# Patient Record
Sex: Female | Born: 1985 | Race: Black or African American | Hispanic: No | Marital: Single | Smoking: Never smoker
Health system: Southern US, Community
[De-identification: ages and names within clinical notes are randomized; demographics above are authoritative.]

## PROBLEM LIST (undated history)

## (undated) ENCOUNTER — Inpatient Hospital Stay (HOSPITAL_COMMUNITY): Payer: Self-pay

## (undated) DIAGNOSIS — F419 Anxiety disorder, unspecified: Secondary | ICD-10-CM

## (undated) DIAGNOSIS — O039 Complete or unspecified spontaneous abortion without complication: Secondary | ICD-10-CM

## (undated) DIAGNOSIS — O9A219 Injury, poisoning and certain other consequences of external causes complicating pregnancy, unspecified trimester: Secondary | ICD-10-CM

## (undated) DIAGNOSIS — F329 Major depressive disorder, single episode, unspecified: Secondary | ICD-10-CM

## (undated) DIAGNOSIS — F32A Depression, unspecified: Secondary | ICD-10-CM

## (undated) DIAGNOSIS — I493 Ventricular premature depolarization: Secondary | ICD-10-CM

## (undated) DIAGNOSIS — I10 Essential (primary) hypertension: Secondary | ICD-10-CM

## (undated) DIAGNOSIS — Z8759 Personal history of other complications of pregnancy, childbirth and the puerperium: Secondary | ICD-10-CM

## (undated) DIAGNOSIS — Z98891 History of uterine scar from previous surgery: Secondary | ICD-10-CM

## (undated) DIAGNOSIS — K122 Cellulitis and abscess of mouth: Secondary | ICD-10-CM

## (undated) DIAGNOSIS — D219 Benign neoplasm of connective and other soft tissue, unspecified: Secondary | ICD-10-CM

## (undated) DIAGNOSIS — R51 Headache: Secondary | ICD-10-CM

## (undated) DIAGNOSIS — Z5189 Encounter for other specified aftercare: Secondary | ICD-10-CM

## (undated) DIAGNOSIS — R0600 Dyspnea, unspecified: Secondary | ICD-10-CM

## (undated) DIAGNOSIS — D649 Anemia, unspecified: Secondary | ICD-10-CM

## (undated) DIAGNOSIS — R000 Tachycardia, unspecified: Secondary | ICD-10-CM

## (undated) DIAGNOSIS — R519 Headache, unspecified: Secondary | ICD-10-CM

## (undated) HISTORY — PX: DILATION AND CURETTAGE OF UTERUS: SHX78

## (undated) HISTORY — DX: History of uterine scar from previous surgery: Z98.891

## (undated) HISTORY — DX: Tachycardia, unspecified: R00.0

## (undated) HISTORY — DX: Ventricular premature depolarization: I49.3

## (undated) HISTORY — PX: WISDOM TOOTH EXTRACTION: SHX21

## (undated) HISTORY — DX: Injury, poisoning and certain other consequences of external causes complicating pregnancy, unspecified trimester: O9A.219

## (undated) HISTORY — PX: ROOT CANAL: SHX2363

## (undated) HISTORY — PX: MYOMECTOMY: SHX85

---

## 2010-10-01 ENCOUNTER — Inpatient Hospital Stay (HOSPITAL_COMMUNITY)
Admission: AD | Admit: 2010-10-01 | Discharge: 2010-10-01 | Disposition: A | Discharge status: Home / Self Care | Payer: Medicaid Other | Source: Ambulatory Visit | Attending: Obstetrics & Gynecology | Admitting: Obstetrics & Gynecology

## 2010-10-01 ENCOUNTER — Inpatient Hospital Stay (HOSPITAL_COMMUNITY): Payer: Medicaid Other

## 2010-10-01 DIAGNOSIS — O9989 Other specified diseases and conditions complicating pregnancy, childbirth and the puerperium: Secondary | ICD-10-CM

## 2010-10-01 DIAGNOSIS — O99891 Other specified diseases and conditions complicating pregnancy: Secondary | ICD-10-CM

## 2010-10-01 DIAGNOSIS — N949 Unspecified condition associated with female genital organs and menstrual cycle: Secondary | ICD-10-CM | POA: Insufficient documentation

## 2010-10-01 LAB — URINALYSIS, ROUTINE W REFLEX MICROSCOPIC
Glucose, UA: NEGATIVE mg/dL
Leukocytes, UA: NEGATIVE
Nitrite: NEGATIVE
Specific Gravity, Urine: >1.03 — ABNORMAL HIGH (ref 1.005–1.030)
pH: 6 (ref 5.0–8.0)

## 2010-10-01 LAB — POCT PREGNANCY, URINE: Preg Test, Ur: POSITIVE

## 2010-10-01 LAB — CBC
MCH: 30.8 pg (ref 26.0–34.0)
MCHC: 34.1 g/dL (ref 30.0–36.0)
MCV: 90.1 fL (ref 78.0–100.0)
Platelets: 206 10*3/uL (ref 150–400)
RDW: 12.7 % (ref 11.5–15.5)

## 2010-10-01 LAB — WET PREP, GENITAL

## 2010-10-01 LAB — URINE MICROSCOPIC-ADD ON

## 2010-10-02 ENCOUNTER — Other Ambulatory Visit: Payer: Self-pay | Admitting: Obstetrics & Gynecology

## 2010-10-02 DIAGNOSIS — O3680X0 Pregnancy with inconclusive fetal viability, not applicable or unspecified: Secondary | ICD-10-CM

## 2010-10-02 LAB — GC/CHLAMYDIA PROBE AMP, GENITAL
Chlamydia, DNA Probe: NEGATIVE
GC Probe Amp, Genital: NEGATIVE

## 2010-10-06 ENCOUNTER — Inpatient Hospital Stay (HOSPITAL_COMMUNITY)
Admission: AD | Admit: 2010-10-06 | Discharge: 2010-10-06 | Disposition: A | Discharge status: Home / Self Care | Payer: Medicaid Other | Source: Ambulatory Visit | Attending: Obstetrics & Gynecology | Admitting: Obstetrics & Gynecology

## 2010-10-06 ENCOUNTER — Ambulatory Visit (HOSPITAL_COMMUNITY)
Admission: RE | Admit: 2010-10-06 | Discharge: 2010-10-06 | Disposition: A | Discharge status: Home / Self Care | Payer: Medicaid Other | Source: Ambulatory Visit | Attending: Obstetrics & Gynecology | Admitting: Obstetrics & Gynecology

## 2010-10-06 DIAGNOSIS — O3680X0 Pregnancy with inconclusive fetal viability, not applicable or unspecified: Secondary | ICD-10-CM

## 2010-10-06 DIAGNOSIS — O200 Threatened abortion: Secondary | ICD-10-CM

## 2010-10-06 DIAGNOSIS — O368390 Maternal care for abnormalities of the fetal heart rate or rhythm, unspecified trimester, not applicable or unspecified: Secondary | ICD-10-CM | POA: Insufficient documentation

## 2010-10-06 DIAGNOSIS — O209 Hemorrhage in early pregnancy, unspecified: Secondary | ICD-10-CM | POA: Insufficient documentation

## 2010-10-06 LAB — CBC
Hemoglobin: 12.7 g/dL (ref 12.0–15.0)
MCHC: 33.3 g/dL (ref 30.0–36.0)
RBC: 4.18 MIL/uL (ref 3.87–5.11)
WBC: 6.7 10*3/uL (ref 4.0–10.5)

## 2010-10-08 ENCOUNTER — Other Ambulatory Visit (HOSPITAL_COMMUNITY): Payer: Medicaid Other

## 2010-10-11 ENCOUNTER — Inpatient Hospital Stay (HOSPITAL_COMMUNITY)
Admission: AD | Admit: 2010-10-11 | Discharge: 2010-10-11 | Disposition: A | Discharge status: Home / Self Care | Payer: Medicaid Other | Source: Ambulatory Visit | Attending: Family Medicine | Admitting: Family Medicine

## 2010-10-11 ENCOUNTER — Inpatient Hospital Stay (HOSPITAL_COMMUNITY): Payer: Medicaid Other

## 2010-10-11 DIAGNOSIS — O034 Incomplete spontaneous abortion without complication: Secondary | ICD-10-CM | POA: Insufficient documentation

## 2010-10-11 DIAGNOSIS — R109 Unspecified abdominal pain: Secondary | ICD-10-CM | POA: Insufficient documentation

## 2010-10-11 LAB — CBC
Hemoglobin: 11.7 g/dL — ABNORMAL LOW (ref 12.0–15.0)
MCV: 90.1 fL (ref 78.0–100.0)
Platelets: 206 10*3/uL (ref 150–400)
RBC: 3.94 MIL/uL (ref 3.87–5.11)
WBC: 10.4 10*3/uL (ref 4.0–10.5)

## 2010-10-25 ENCOUNTER — Encounter (INDEPENDENT_AMBULATORY_CARE_PROVIDER_SITE_OTHER): Payer: Medicaid Other | Admitting: Advanced Practice Midwife

## 2010-10-25 ENCOUNTER — Other Ambulatory Visit: Payer: Self-pay | Admitting: Obstetrics and Gynecology

## 2010-10-25 ENCOUNTER — Ambulatory Visit (HOSPITAL_COMMUNITY)
Admission: RE | Admit: 2010-10-25 | Discharge: 2010-10-25 | Disposition: A | Discharge status: Home / Self Care | Payer: Medicaid Other | Source: Ambulatory Visit | Attending: Obstetrics and Gynecology | Admitting: Obstetrics and Gynecology

## 2010-10-25 ENCOUNTER — Encounter: Payer: Self-pay | Admitting: Obstetrics and Gynecology

## 2010-10-25 DIAGNOSIS — IMO0002 Reserved for concepts with insufficient information to code with codable children: Secondary | ICD-10-CM | POA: Insufficient documentation

## 2010-10-25 DIAGNOSIS — O034 Incomplete spontaneous abortion without complication: Secondary | ICD-10-CM

## 2010-10-25 DIAGNOSIS — D251 Intramural leiomyoma of uterus: Secondary | ICD-10-CM | POA: Insufficient documentation

## 2010-10-26 NOTE — Progress Notes (Unsigned)
Tiffany Solomon, SPICER NO.:  [id number]  MEDICAL RECORD NO.:  [id number]           PATIENT TYPE:  A  LOCATION:  [facility]                   FACILITY:  [facility]  PHYSICIAN:  Argentina Donovan, MD        DATE OF BIRTH:  02-17-1986  DATE OF SERVICE:  10/25/2010                                 CLINIC NOTE  The patient is a 25 year old, gravida 1, para 0-0-1-0 who went into the MAU in early pregnancy on October 01, 2010.  She had an ultrasound and abated at that time.  She went in because of some bleeding.  The ultrasound looked okay, but they did see some chronic bleed and her beta at that time was 25,126.  She returned because of pain and bleeding on the 7th.  They put her on Cytotec by mouth 800 mcg.  She had heavy bleeding the following day with a lot of pain.  She waited a few days. The bleeding did not let up nor the pain and she went back on March 12. At that point, they repeated the 800 mcg of Cytotec, but they gave it to her vaginally.  For the next few days, the heavy bleeding had increased. The pain was much worse and then she passed a big clot.  The pain went away, but the bleeding has continued and now at this time, 24 days later she is still bleeding like a period.  I have given a quantitative beta on her.  We are going to repeat the ultrasound.  I am going to have her come back next week Monday.  Also, given her prescription for Sprintec. She wants to start birth control pills as soon as we know what is going on with her.  IMPRESSION:  Complete abortion versus incomplete abortion.  My feeling is that this is just some residual bleeding and that will eventually stop.  However, 24 days is a long time to be bleeding, so we will follow her up a little closer than usual.          ______________________________ Argentina Donovan, MD    PR/MEDQ  D:  10/25/2010  T:  10/26/2010  Job:  [id number]

## 2010-10-31 DEATH — deceased

## 2010-11-02 ENCOUNTER — Inpatient Hospital Stay (HOSPITAL_COMMUNITY)
Admission: AD | Admit: 2010-11-02 | Discharge: 2010-11-02 | Disposition: A | Discharge status: Home / Self Care | Payer: Medicaid Other | Source: Ambulatory Visit | Attending: Obstetrics & Gynecology | Admitting: Obstetrics & Gynecology

## 2010-11-02 DIAGNOSIS — N949 Unspecified condition associated with female genital organs and menstrual cycle: Secondary | ICD-10-CM | POA: Insufficient documentation

## 2010-11-02 DIAGNOSIS — N938 Other specified abnormal uterine and vaginal bleeding: Secondary | ICD-10-CM

## 2010-11-02 DIAGNOSIS — D259 Leiomyoma of uterus, unspecified: Secondary | ICD-10-CM | POA: Insufficient documentation

## 2010-11-02 LAB — CBC
MCV: 89 fL (ref 78.0–100.0)
Platelets: 309 10*3/uL (ref 150–400)
RBC: 3.17 MIL/uL — ABNORMAL LOW (ref 3.87–5.11)
WBC: 7.7 10*3/uL (ref 4.0–10.5)

## 2010-11-04 ENCOUNTER — Other Ambulatory Visit: Payer: Self-pay | Admitting: Obstetrics & Gynecology

## 2010-11-04 ENCOUNTER — Ambulatory Visit (HOSPITAL_COMMUNITY)
Admission: RE | Admit: 2010-11-04 | Discharge: 2010-11-04 | Disposition: A | Discharge status: Home / Self Care | Payer: Medicaid Other | Source: Ambulatory Visit | Attending: Obstetrics & Gynecology | Admitting: Obstetrics & Gynecology

## 2010-11-04 ENCOUNTER — Ambulatory Visit (INDEPENDENT_AMBULATORY_CARE_PROVIDER_SITE_OTHER): Payer: Medicaid Other | Admitting: Obstetrics and Gynecology

## 2010-11-04 DIAGNOSIS — O034 Incomplete spontaneous abortion without complication: Secondary | ICD-10-CM | POA: Insufficient documentation

## 2010-11-04 DIAGNOSIS — O036 Delayed or excessive hemorrhage following complete or unspecified spontaneous abortion: Secondary | ICD-10-CM

## 2010-11-04 DIAGNOSIS — N949 Unspecified condition associated with female genital organs and menstrual cycle: Secondary | ICD-10-CM

## 2010-11-04 LAB — CBC
HCT: 23.9 % — ABNORMAL LOW (ref 36.0–46.0)
Hemoglobin: 7.8 g/dL — ABNORMAL LOW (ref 12.0–15.0)
MCHC: 32.6 g/dL (ref 30.0–36.0)
WBC: 8.2 10*3/uL (ref 4.0–10.5)

## 2010-11-05 NOTE — Progress Notes (Signed)
Tiffany Solomon, NADER NO.:  [id number]  MEDICAL RECORD NO.:  [id number]           PATIENT TYPE:  O  LOCATION:  [facility]                    [facility]  PHYSICIAN:  Argentina Donovan, MD        DATE OF BIRTH:  June 24, 1986  DATE OF SERVICE:  11/04/2010                                 CLINIC NOTE  The patient is a 25 year old gravida 1, para 1-0-0-1-0-1 to the MAU with early pregnancy on October 01, 2010.  She had an ultrasound aborted at that time.  She was in because of bleeding.  The ultrasound looked okay. They did see some chronic bleeding and her beta was 25,000 at that time. She returned because of the pain.  She was put on Cytotec by mouth 800 mcg.  She had a lot of pain at that time following that followed by heavy bleeding.  She went back on October 11, 2010.  They repeated the Cytotec.  At this time gave it to her vaginally.  Pain was much worse this time where she passed large clot.  The ultrasound did show that she had, had fibroid uterus.  This displaced the endometrial stripe.  The bleeding however, continues.  At the time she was first seen she had a hemoglobin of 12.7.  She apparently has continued to bleed and ended up 2 days ago in the MAU where she was seen still bleeding and still passing clots and at that point had a hemoglobin of 9.3.  Yesterday, the fourth, she said she felt very dizzy and fainted at home.  She drank fluids and was able to get up this morning.  She had something small to eat this morning and went to work.  At work, she got very dizzy, diaphoretic, and came in here still passing clots and bleeding.  I have called Dr. Debroah Loop.  She has not eaten since morning.  We are sending her up to MAU to get a CBC, type and cross, and start an IV on her to prepare for possible D and C.  IMPRESSION:  Possible abortion bleeding with uterine fibroids.          ______________________________ Argentina Donovan, MD    PR/MEDQ  D:  11/04/2010  T:   11/05/2010  Job:  [id number]

## 2010-11-08 LAB — CROSSMATCH
Antibody Screen: NEGATIVE
Unit division: 0

## 2010-11-13 NOTE — Op Note (Signed)
  Tiffany Solomon, Tiffany Solomon              ACCOUNT NO.:  [id number]  MEDICAL RECORD NO.:  [id number]           PATIENT TYPE:  O  LOCATION:  [facility]                          [facility]  PHYSICIAN:  Scheryl Darter, MD       DATE OF BIRTH:  02/12/1986  DATE OF PROCEDURE: DATE OF DISCHARGE:                              OPERATIVE REPORT   PROCEDURE:  Suction, dilation and curettage.  PREOPERATIVE DIAGNOSIS:  Incomplete abortion.  POSTOPERATIVE DIAGNOSIS:  Incomplete abortion.  SURGEON:  Scheryl Darter, MD  ANESTHESIA:  MAC by Dr. Arby Barrette and Pat Patrick, CRNA  SPECIMEN:  Products of conception.  FINDINGS:  Uterus sounds to 11 cm.  ESTIMATED BLOOD LOSS:  Minimal.  COMPLICATIONS:  None.  DRAINS:  None.  COUNTS:  Correct.  OPERATIVE COURSE:  The patient gave written consent for suction, dilation and curettage with diagnosis of incomplete abortion.  The patient had received 2 doses of Cytotec previously for miscarriage.  The patient identification was confirmed.  She was brought to the OR and MAC anesthesia was induced.  She was placed in dorsal lithotomy position. Perineum and vagina sterilely prepped and draped and bladder was drained with a red rubber catheter.  Speculum was inserted.  Cervix was visualized, grasped with a single-tooth tenaculum.  Lidocaine 1% was infiltrated for intracervical block.  Uterus sounded to 11 cm.  Cervix was dilated sufficiently to pass a 9-mm suction curette.  Suction curettage was performed and small amount of products of conception were obtained. Complete evacuation of uterine cavity was assured.  She received IV Pitocin during the procedure.  There is minimal bleeding at the end of procedure.  All instruments were removed.  The patient tolerated the procedure well without complications.  She was brought in stable condition to the recovery room.     Scheryl Darter, MD     JA/MEDQ  D:  11/04/2010  T:  11/05/2010  Job:   [id number]  Electronically Signed by Scheryl Darter MD on 11/13/2010 07:53:00 AM

## 2010-11-17 ENCOUNTER — Ambulatory Visit: Payer: Medicaid Other | Admitting: Obstetrics & Gynecology

## 2010-11-17 DIAGNOSIS — O034 Incomplete spontaneous abortion without complication: Secondary | ICD-10-CM

## 2010-11-17 DIAGNOSIS — D259 Leiomyoma of uterus, unspecified: Secondary | ICD-10-CM

## 2010-11-18 NOTE — Group Therapy Note (Signed)
NAMEELBONY, Tiffany Solomon              ACCOUNT NO.:  [id number]  MEDICAL RECORD NO.:  [id number]           PATIENT TYPE:  A  LOCATION:  [facility]                   [facility]  [facility]  PHYSICIAN:  Scheryl Darter, MD       DATE OF BIRTH:  [birth year]  DATE OF SERVICE:  11/17/2010                                 CLINIC NOTE  The patient is status post suction dilation and curettage on November 04, 2010, for incomplete miscarriage.  She has fibroid uterus.  Bleeding stopped about 3 days ago.  Still feels some fullness in the lower abdomen, which she thinks as her uterus.  She also feels somewhat weak. She was anemic at the time of surgery with a hemoglobin of 7.8. Pathology showed products of conception.  PHYSICAL EXAMINATION:  GENERAL:  She does not appear pale.  Her affect is normal. ABDOMEN:  Soft and nontender.  No mass. PELVIC/EXTERNAL GENITALIA:  Vagina appeared normal.  Uterus appears about 8-10 weeks size and firm consistent with fibroid uterus.  No adnexal masses.  She says she has prescription for oral contraceptives.  She can start these today.  We will check a CBC today.  She is to follow up on her anemia.  She will continue taking iron supplement.  We will have her return in 3-4 months to review her progress with her menstrual periods on oral contraceptives.     Scheryl Darter, MD    JA/MEDQ  D:  11/17/2010  T:  11/18/2010  Job:  [id number]

## 2010-11-19 ENCOUNTER — Ambulatory Visit (HOSPITAL_COMMUNITY)
Admission: AD | Admit: 2010-11-19 | Discharge: 2010-11-20 | Disposition: A | Discharge status: Home / Self Care | Payer: Medicaid Other | Source: Ambulatory Visit | Attending: Obstetrics and Gynecology | Admitting: Obstetrics and Gynecology

## 2010-11-19 DIAGNOSIS — D62 Acute posthemorrhagic anemia: Principal | ICD-10-CM | POA: Insufficient documentation

## 2010-11-20 LAB — CBC
Hemoglobin: 11.2 g/dL — ABNORMAL LOW (ref 12.0–15.0)
MCHC: 32.8 g/dL (ref 30.0–36.0)
WBC: 7.8 10*3/uL (ref 4.0–10.5)

## 2010-11-21 LAB — CROSSMATCH
Unit division: 0
Unit division: 0
Unit division: 0

## 2011-08-07 IMAGING — US US OB TRANSVAGINAL
1 series · 14 of 28 positions shown · non-contrast
Comparison: none

[Series 1: us ob transvaginal · 14 of 28 slices shown]
[im 2/28]
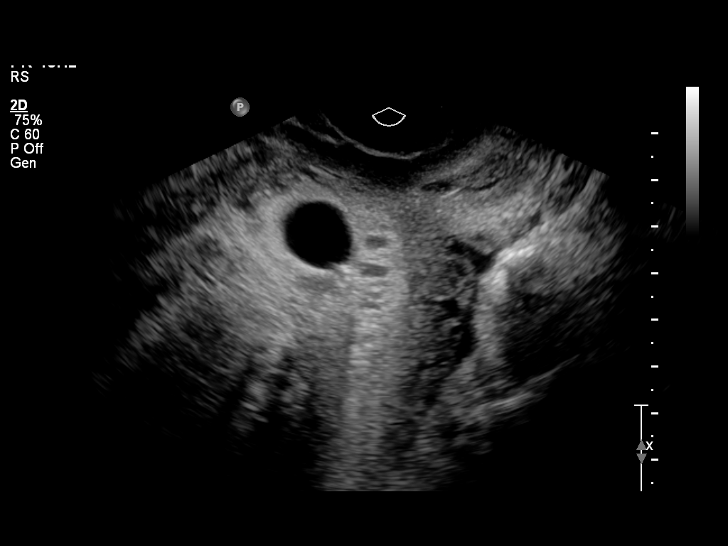
[im 4/28]
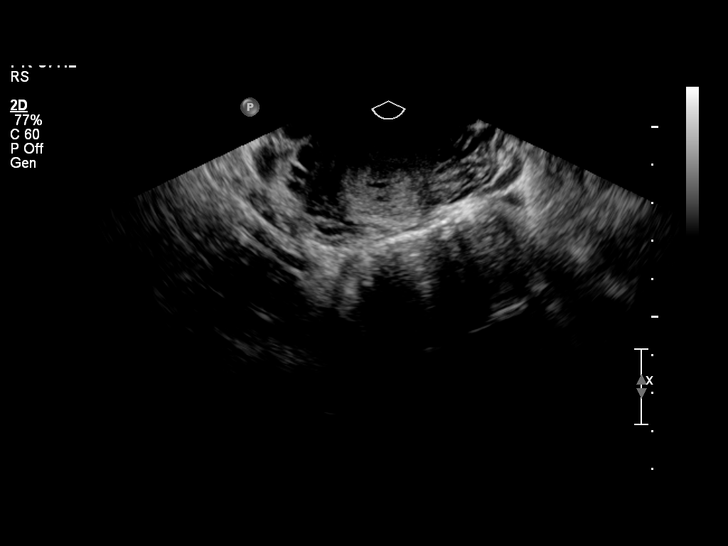
[im 6/28]
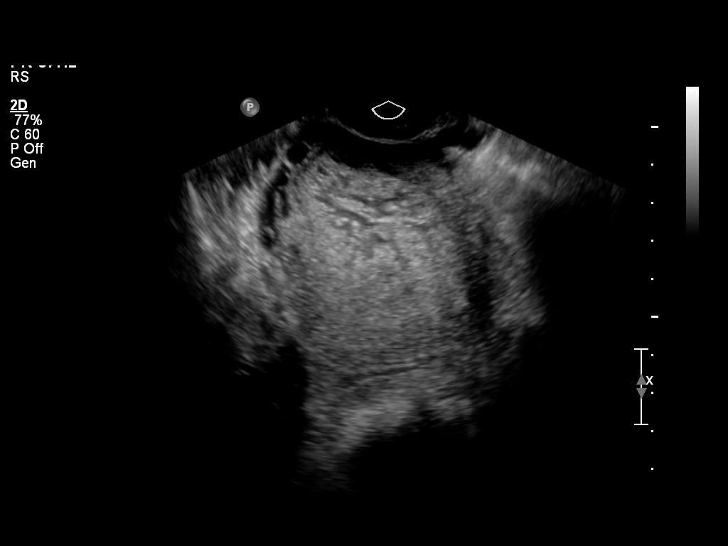
[im 8/28]
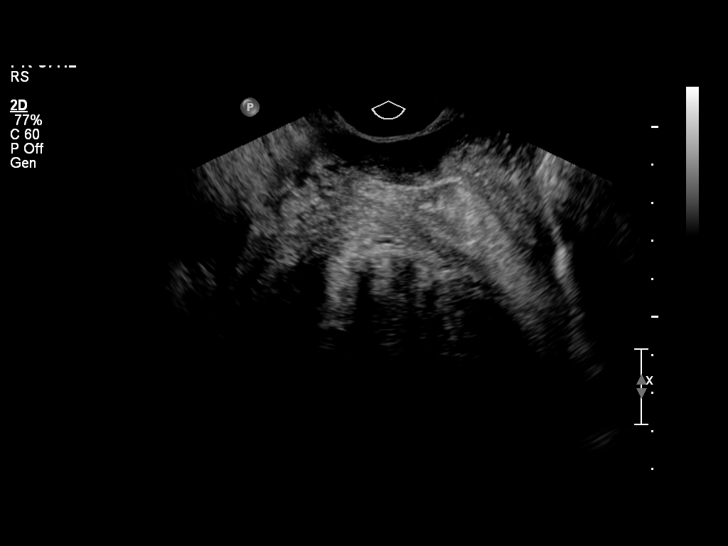
[im 10/28]
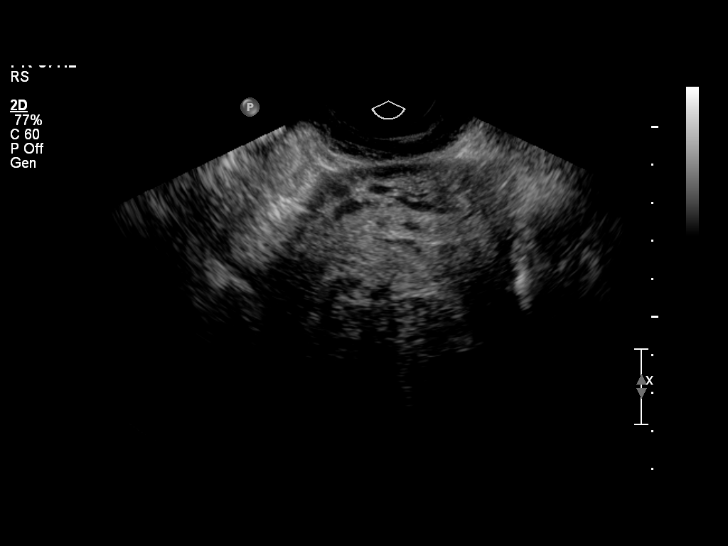
[im 12/28]
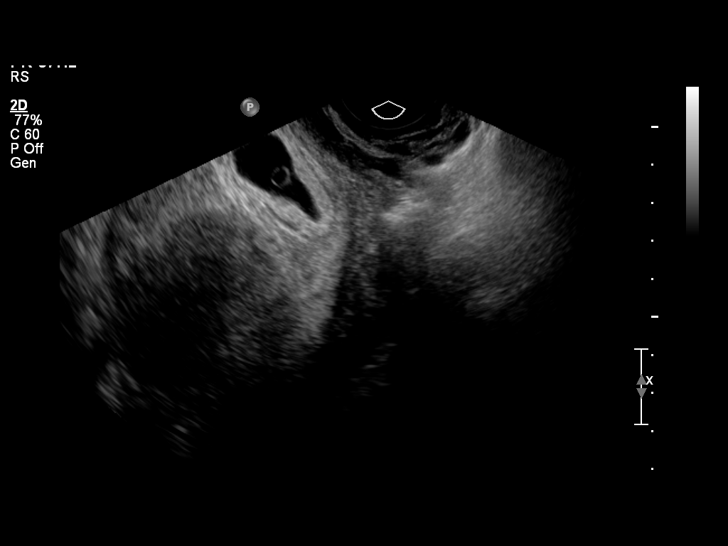
[im 14/28]
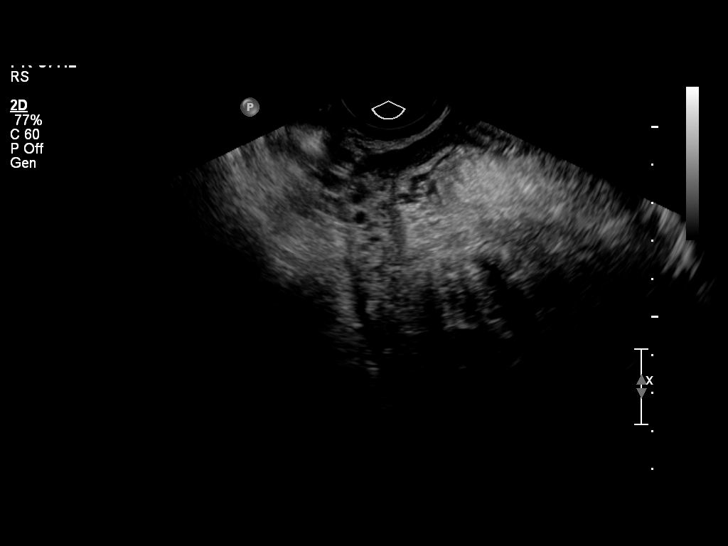
[im 16/28]
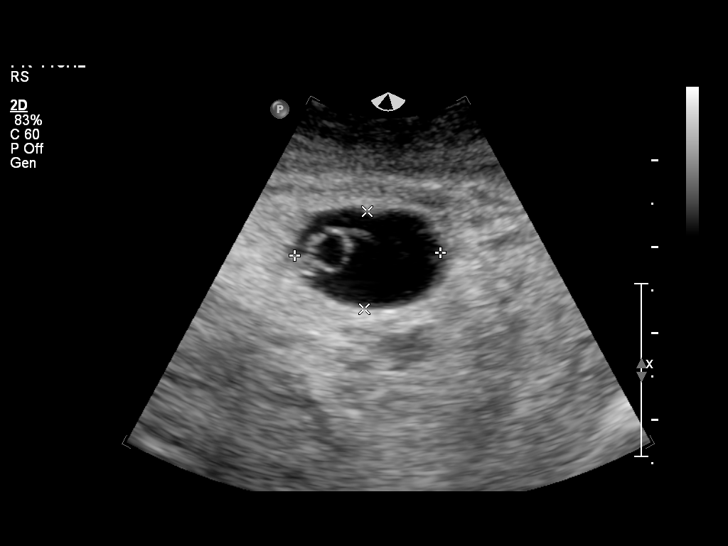
[im 18/28]
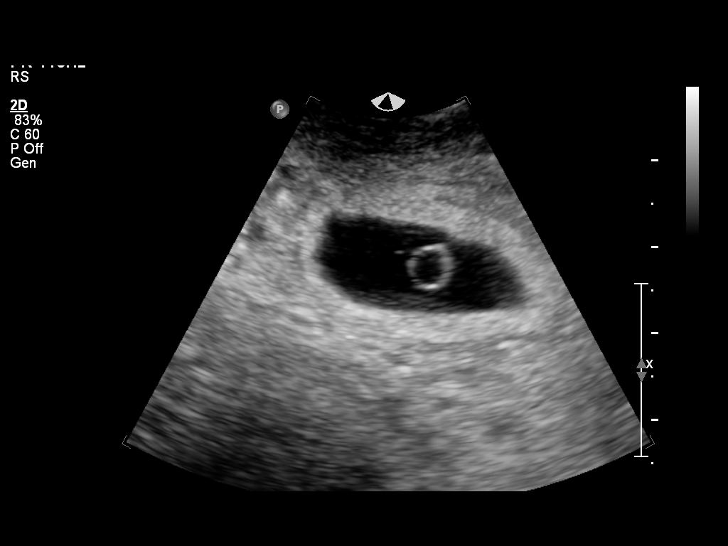
[im 20/28]
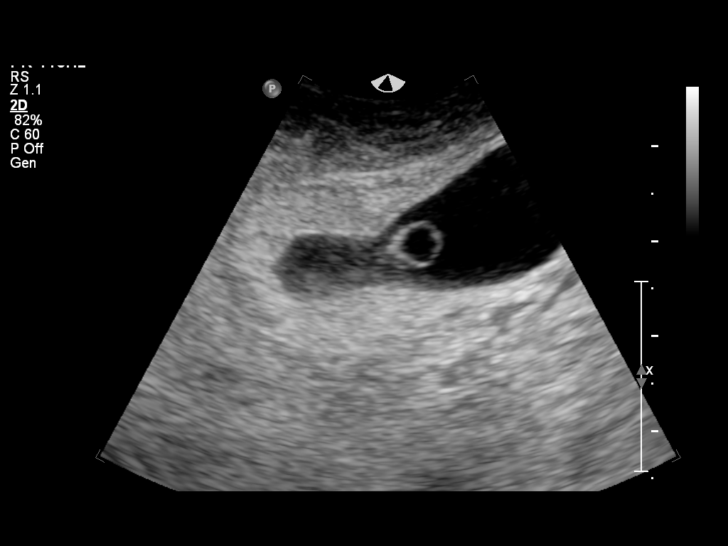
[im 22/28]
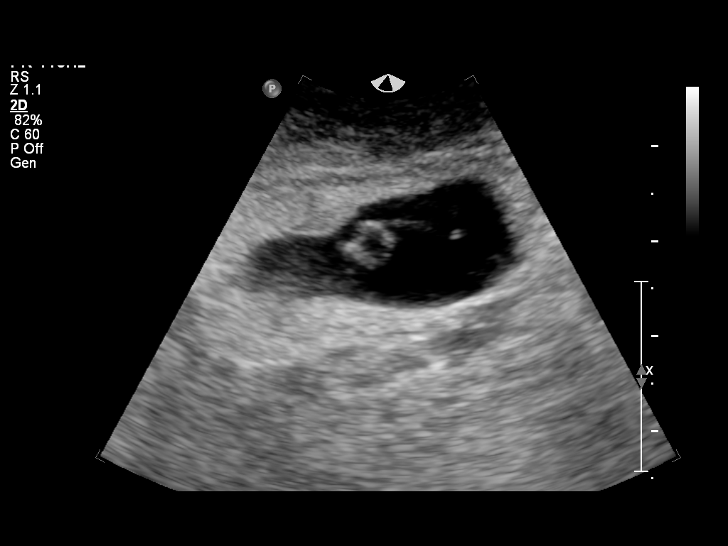
[im 24/28]
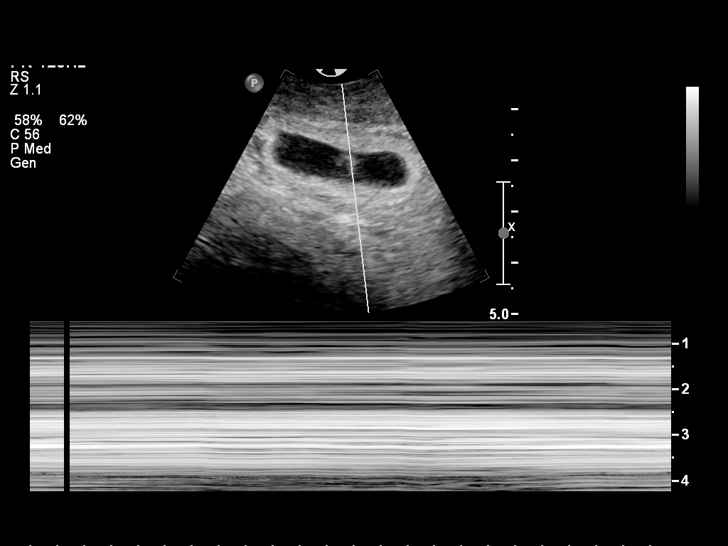
[im 26/28]
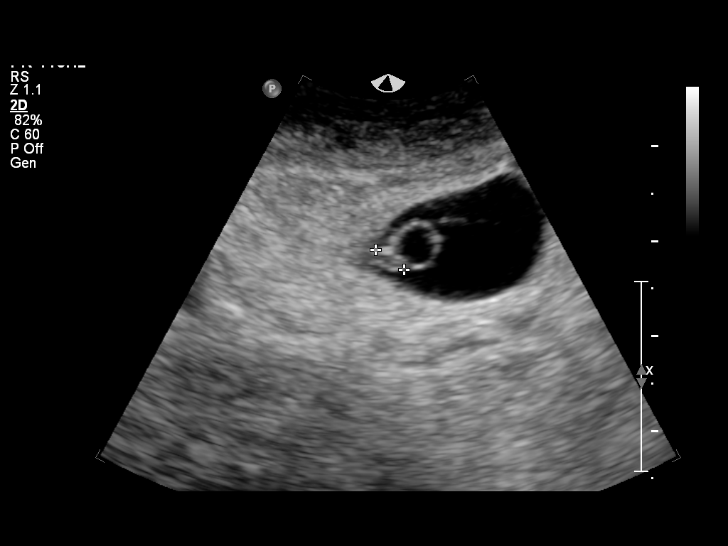
[im 28/28]
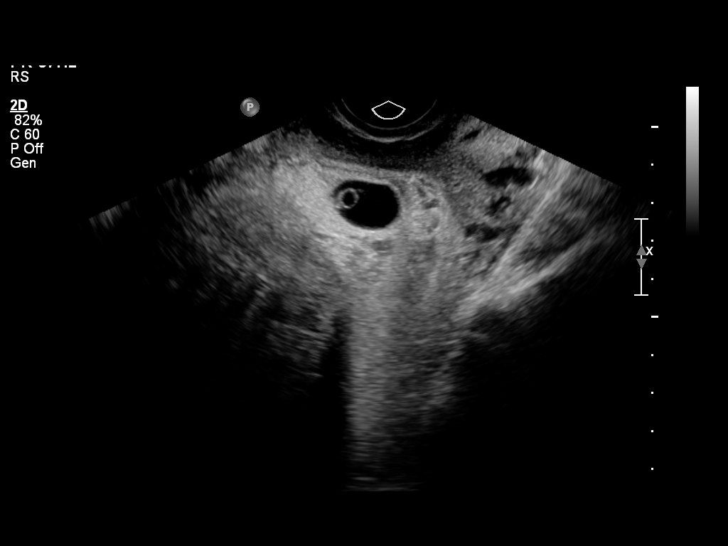

[14 of 28 positions shown; findings below may reference images not displayed]

OBSTETRICS REPORT
                      (Signed Final 10/06/2010 [DATE])

 Order#:         [id number]
Procedures

 US OB TRANSVAGINAL                                    76817.0
Indications

 No fetal cardiac activity detected;  assess viability
 Vaginal bleeding, unknown etiology
Fetal Evaluation

 Gest. Sac:         Intrauterine, small
                    subchorionic bleed
 Yolk Sac:          Visualized
 Fetal Pole:        Visualized
 Cardiac Activity:  Not visualized
Biometry

 GS:      17.7  mm     G. Age:  6w 6d                  EDD:    05/26/11
 CRL:      3.3  mm     G. Age:  6w 0d                  EDD:    06/01/11
Cervix Uterus Adnexa

 Cervix:       Closed.

 Adnexa:     No abnormality visualized.
Impression

 Intrauterine gestational sac, yolk sac, and fetal pole
 visualized but no cardiac activity yet seen. Lack of fetal
 growth and interval expected development of cardiac activity
 today compared to the prior study is compatible with failed
 IUP.
 Small subchorionic hemorrhage noted.

## 2011-08-26 IMAGING — US US PELVIS COMPLETE
1 series · 13 of 25 positions shown · non-contrast
Comparison: 10/11/2010

CLINICAL DATA: Vaginal bleeding.  Recent abortion.



[Series 1: us pelvis complete · 13 of 39 slices shown]
[im 1/39]
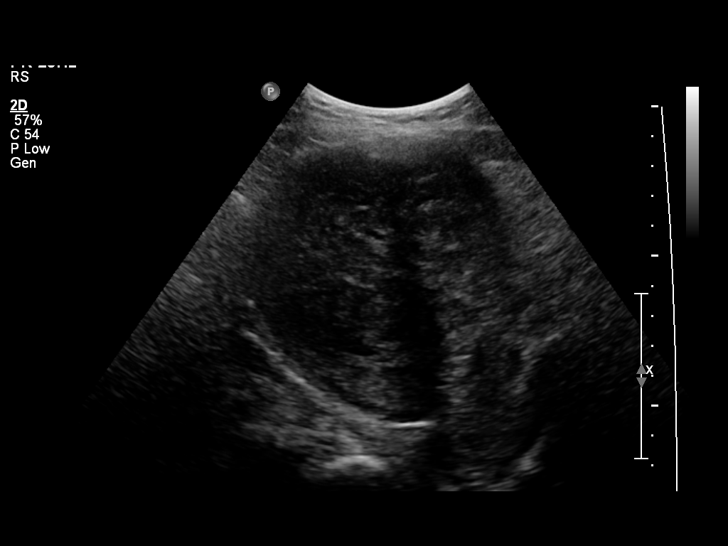
[im 4/39]
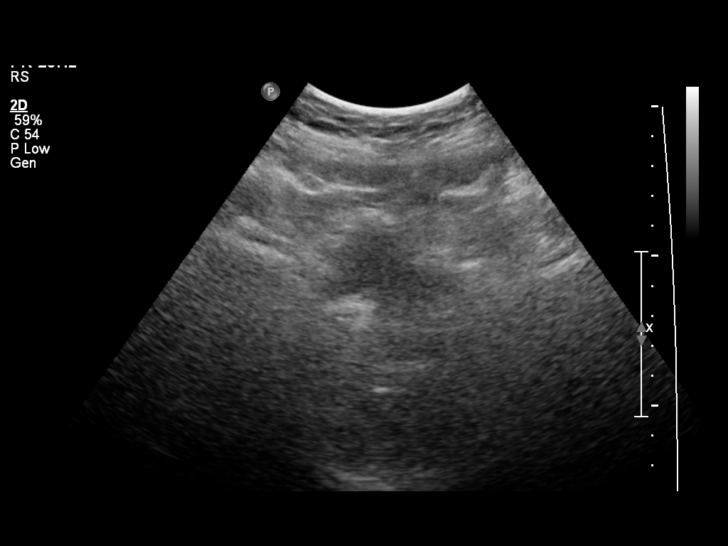
[im 7/39]
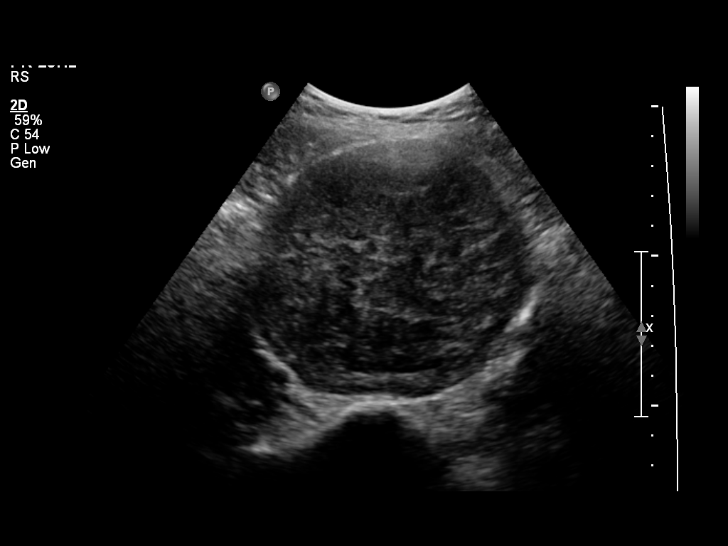
[im 10/39]
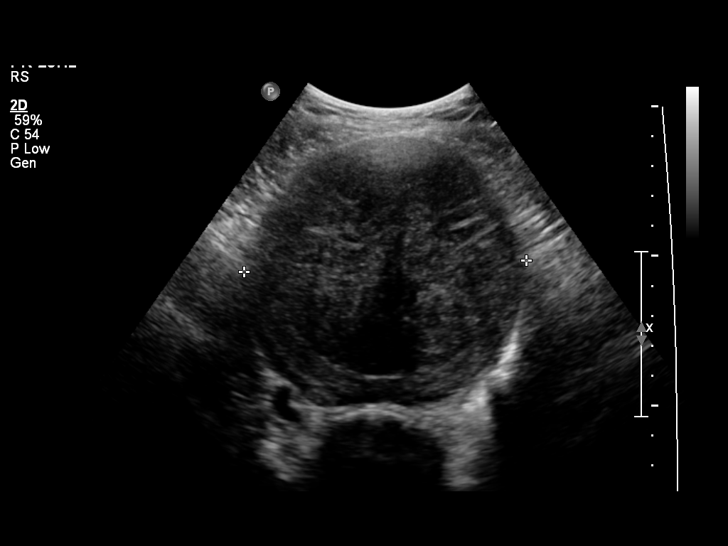
[im 13/39]
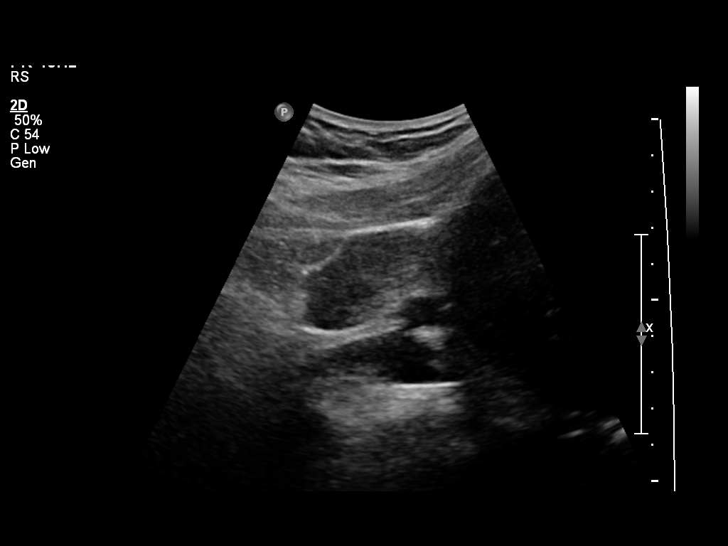
[im 16/39]
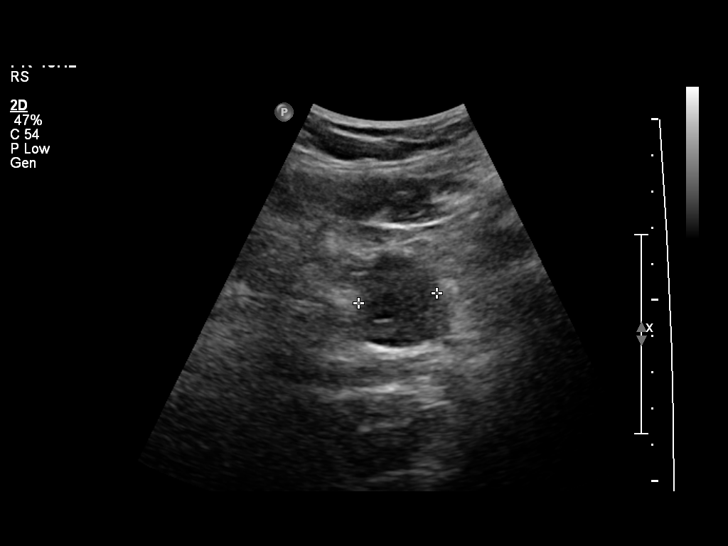
[im 20/39]
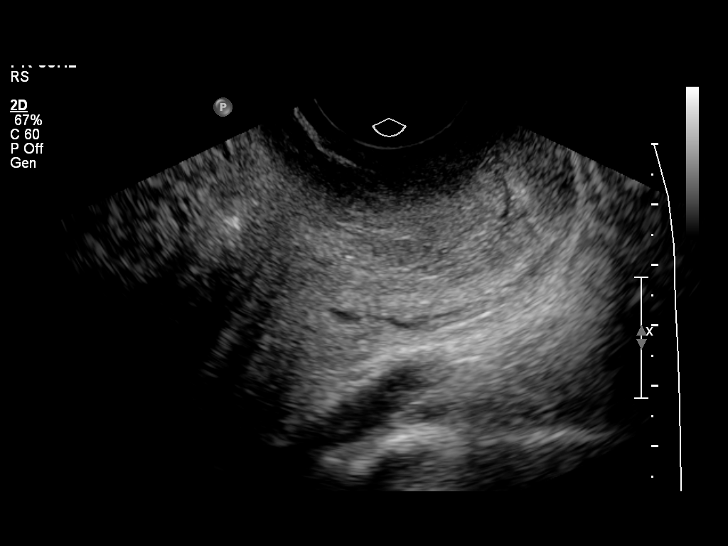
[im 23/39]
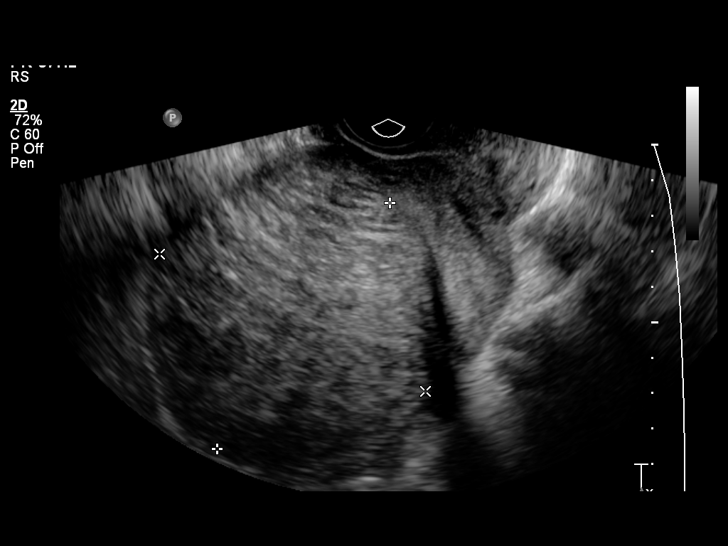
[im 26/39]
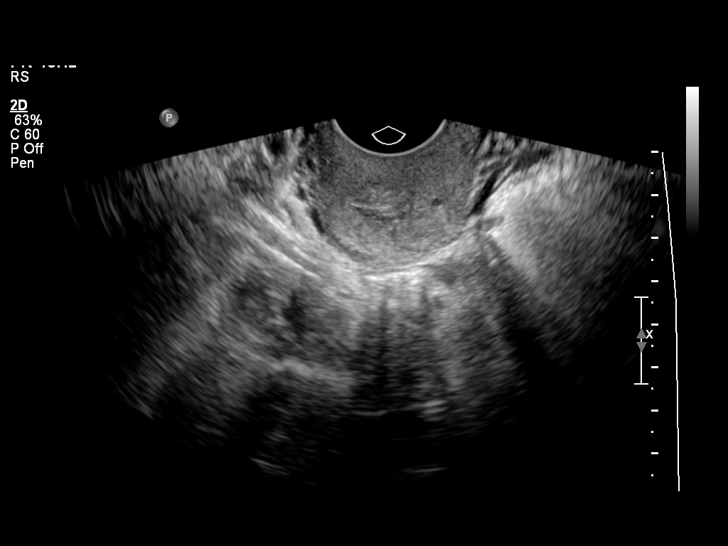
[im 29/39]
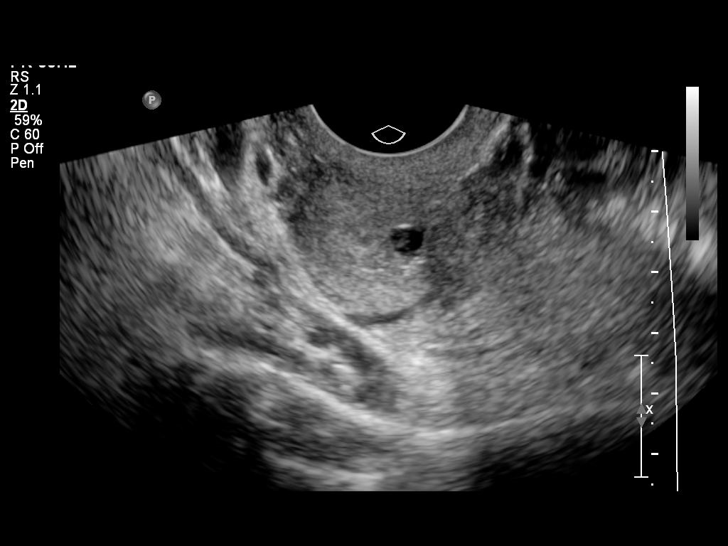
[im 32/39]
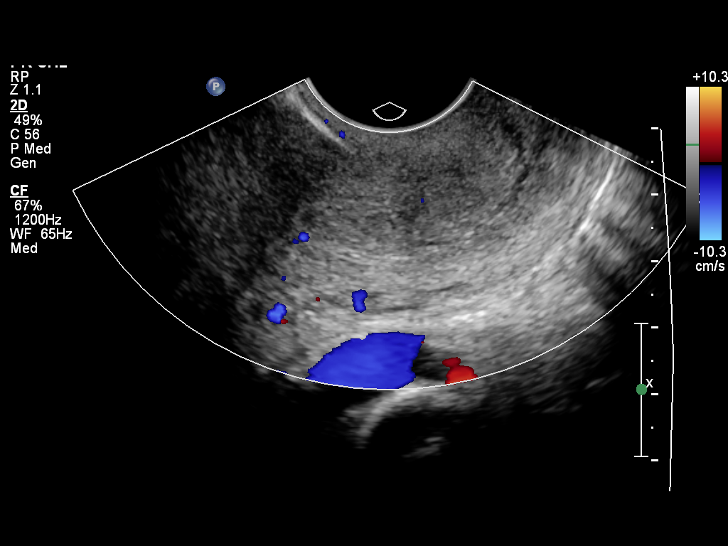
[im 35/39]
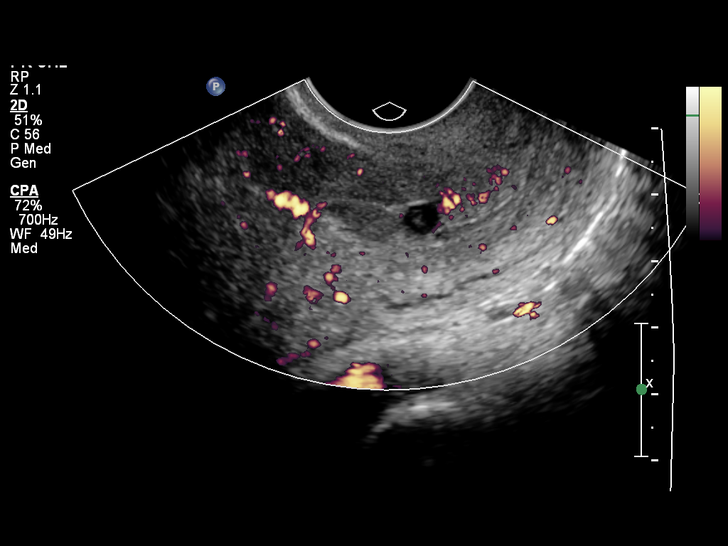
[im 39/39]
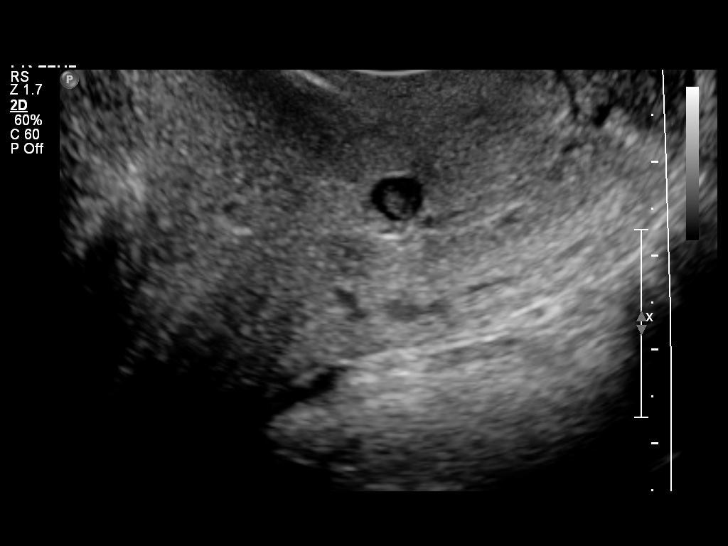

[13 of 25 positions shown; findings below may reference images not displayed]

FINDINGS: Uterus:  12.2 x 9.5 x 8.8 cm.  Anterior intramural fibroid
producing posterior displacement of the endometrial stripe measures
8.5 x 8.4 x 8.0 cm. No gestational sac identified.  Probable
Nabothian cyst or debris in the cervical canal incidentally noted.

Endometrium:  Obscured by overlying fibroid.  Previously questioned
echogenic prominence to the endometrium of the lower uterine
segment is no longer identified.  No focal abnormality visualized.

Right ovary:  Normal in size and appearance.  No adnexal mass.

Left ovary:  Normal in size and appearance on.  No adnexal mass.

Other findings:  No free fluid.
IMPRESSION: No sonographic evidence for retained products of conception,
allowing for obscuration of the endometrial stripe by large fundal
fibroid.

## 2012-12-25 ENCOUNTER — Ambulatory Visit: Payer: Self-pay

## 2014-01-13 ENCOUNTER — Other Ambulatory Visit (HOSPITAL_COMMUNITY)
Admission: RE | Admit: 2014-01-13 | Discharge: 2014-01-13 | Disposition: A | Discharge status: Home / Self Care | Payer: 59 | Source: Ambulatory Visit | Attending: Obstetrics and Gynecology | Admitting: Obstetrics and Gynecology

## 2014-01-13 ENCOUNTER — Other Ambulatory Visit: Payer: Self-pay | Admitting: Obstetrics and Gynecology

## 2014-01-13 DIAGNOSIS — Z01419 Encounter for gynecological examination (general) (routine) without abnormal findings: Secondary | ICD-10-CM | POA: Insufficient documentation

## 2014-01-13 DIAGNOSIS — Z113 Encounter for screening for infections with a predominantly sexual mode of transmission: Secondary | ICD-10-CM | POA: Insufficient documentation

## 2014-01-15 LAB — CYTOLOGY - PAP

## 2014-07-01 ENCOUNTER — Emergency Department (HOSPITAL_COMMUNITY)
Admission: EM | Admit: 2014-07-01 | Discharge: 2014-07-01 | Discharge status: Against Medical Advice | Payer: 59 | Attending: Emergency Medicine | Admitting: Emergency Medicine

## 2014-07-01 ENCOUNTER — Encounter (HOSPITAL_COMMUNITY): Payer: Self-pay | Admitting: *Deleted

## 2014-07-01 DIAGNOSIS — Z3A38 38 weeks gestation of pregnancy: Secondary | ICD-10-CM | POA: Insufficient documentation

## 2014-07-01 DIAGNOSIS — R0602 Shortness of breath: Secondary | ICD-10-CM | POA: Diagnosis not present

## 2014-07-01 DIAGNOSIS — O9989 Other specified diseases and conditions complicating pregnancy, childbirth and the puerperium: Secondary | ICD-10-CM | POA: Insufficient documentation

## 2014-07-01 LAB — CBC
HCT: 37.4 % (ref 36.0–46.0)
HEMOGLOBIN: 12.4 g/dL (ref 12.0–15.0)
MCH: 29.4 pg (ref 26.0–34.0)
MCHC: 33.2 g/dL (ref 30.0–36.0)
MCV: 88.6 fL (ref 78.0–100.0)
Platelets: 232 10*3/uL (ref 150–400)
RBC: 4.22 MIL/uL (ref 3.87–5.11)
RDW: 13.8 % (ref 11.5–15.5)
WBC: 9.2 10*3/uL (ref 4.0–10.5)

## 2014-07-01 LAB — BASIC METABOLIC PANEL
Anion gap: 12 (ref 5–15)
BUN: 3 mg/dL — AB (ref 6–23)
CHLORIDE: 102 meq/L (ref 96–112)
CO2: 23 mEq/L (ref 19–32)
CREATININE: 0.55 mg/dL (ref 0.50–1.10)
Calcium: 9.2 mg/dL (ref 8.4–10.5)
GFR calc non Af Amer: >90 mL/min (ref >90)
GLUCOSE: 94 mg/dL (ref 70–99)
POTASSIUM: 4 meq/L (ref 3.7–5.3)
Sodium: 137 mEq/L (ref 137–147)

## 2014-07-01 NOTE — ED Notes (Signed)
Sob x 4 days. Non productive. No cp, no dizziness.

## 2014-07-01 NOTE — ED Notes (Addendum)
Pt declined chest xray due to being [redacted]wks pregnant. Pt denies SOB at this time

## 2014-08-07 ENCOUNTER — Inpatient Hospital Stay (HOSPITAL_COMMUNITY): Payer: 59

## 2014-08-07 ENCOUNTER — Inpatient Hospital Stay (HOSPITAL_COMMUNITY)
Admission: AD | Admit: 2014-08-07 | Discharge: 2014-08-07 | Disposition: A | Discharge status: Home / Self Care | Payer: 59 | Source: Ambulatory Visit | Attending: Obstetrics and Gynecology | Admitting: Obstetrics and Gynecology

## 2014-08-07 ENCOUNTER — Encounter (HOSPITAL_COMMUNITY): Payer: Self-pay | Admitting: *Deleted

## 2014-08-07 DIAGNOSIS — Y92481 Parking lot as the place of occurrence of the external cause: Secondary | ICD-10-CM | POA: Diagnosis not present

## 2014-08-07 DIAGNOSIS — O9989 Other specified diseases and conditions complicating pregnancy, childbirth and the puerperium: Secondary | ICD-10-CM | POA: Diagnosis not present

## 2014-08-07 DIAGNOSIS — Z3A33 33 weeks gestation of pregnancy: Secondary | ICD-10-CM | POA: Diagnosis not present

## 2014-08-07 DIAGNOSIS — O368130 Decreased fetal movements, third trimester, not applicable or unspecified: Secondary | ICD-10-CM | POA: Insufficient documentation

## 2014-08-07 DIAGNOSIS — O9A213 Injury, poisoning and certain other consequences of external causes complicating pregnancy, third trimester: Secondary | ICD-10-CM

## 2014-08-07 DIAGNOSIS — T1490XA Injury, unspecified, initial encounter: Secondary | ICD-10-CM

## 2014-08-07 DIAGNOSIS — W1839XA Other fall on same level, initial encounter: Secondary | ICD-10-CM

## 2014-08-07 DIAGNOSIS — S3991XA Unspecified injury of abdomen, initial encounter: Secondary | ICD-10-CM

## 2014-08-07 DIAGNOSIS — W01198A Fall on same level from slipping, tripping and stumbling with subsequent striking against other object, initial encounter: Secondary | ICD-10-CM | POA: Insufficient documentation

## 2014-08-07 DIAGNOSIS — O9A219 Injury, poisoning and certain other consequences of external causes complicating pregnancy, unspecified trimester: Secondary | ICD-10-CM | POA: Insufficient documentation

## 2014-08-07 HISTORY — DX: Benign neoplasm of connective and other soft tissue, unspecified: D21.9

## 2014-08-07 LAB — URINALYSIS, ROUTINE W REFLEX MICROSCOPIC
Bilirubin Urine: NEGATIVE
Glucose, UA: NEGATIVE mg/dL
Hgb urine dipstick: NEGATIVE
Ketones, ur: NEGATIVE mg/dL
NITRITE: NEGATIVE
PH: 7 (ref 5.0–8.0)
Protein, ur: NEGATIVE mg/dL
Specific Gravity, Urine: 1.02 (ref 1.005–1.030)
UROBILINOGEN UA: 0.2 mg/dL (ref 0.0–1.0)

## 2014-08-07 LAB — URINE MICROSCOPIC-ADD ON

## 2014-08-07 MED ORDER — LACTATED RINGERS IV BOLUS (SEPSIS)
1000.0000 mL | Freq: Once | INTRAVENOUS | Status: AC
  Administered 2014-08-07: 1000 mL via INTRAVENOUS

## 2014-08-07 NOTE — MAU Provider Note (Signed)
History     CSN: [id number]  Arrival date and time: 08/07/14 1333   First Provider Initiated Contact with Patient 08/07/14 1426      Chief Complaint  Patient presents with  . Fall   HPI  Ms. Tiffany Solomon is a 29 y.o. G1P0 at [redacted]w[redacted]d who presents to MAU today with complaint of a fall on her abdomen in the parking lot at work this morning. She denies abdominal pain at this time. She states only pain is left knee and hand. She was evaluated at Urgent Care for knee pain earlier today. Xray was not performed due to pregnancy. She denies vaginal bleeding, LOF or contractions. She does states decreased fetal movement since fall.   OB History    Gravida Para Term Preterm AB TAB SAB Ectopic Multiple Living   1               Past Medical History  Diagnosis Date  . Fibroid     Past Surgical History  Procedure Laterality Date  . Myomectomy    . Dilation and curettage of uterus      No family history on file.  History  Substance Use Topics  . Smoking status: Never Smoker   . Smokeless tobacco: Not on file  . Alcohol Use: No    Allergies: No Known Allergies  Prescriptions prior to admission  Medication Sig Dispense Refill Last Dose  . Miconazole Nitrate (MONISTAT 3 VA) Place 1 application vaginally daily.   08/06/2014 at Unknown time  . Phenyleph-CPM-DM-APAP (TYLENOL COLD MULTI-SYMPTOM PO) Take 1 tablet by mouth every 4 (four) hours as needed (cold symptoms).   Past Week at Unknown time  . Prenatal Vit-Fe Fumarate-FA (PRENATAL MULTIVITAMIN) TABS tablet Take 1 tablet by mouth daily at 12 noon.   08/06/2014 at Unknown time  . Witch Hazel (PREPARATION H EX) Apply 1 application topically at bedtime.   08/06/2014 at Unknown time    Review of Systems  Constitutional: Negative for malaise/fatigue.  Gastrointestinal: Negative for abdominal pain.  Genitourinary:       Neg - vaginal bleeding, discharge, LOF   Physical Exam   Blood pressure 129/77, pulse 101, temperature 98 F (36.7  C), temperature source Oral, resp. rate 20, height 5\' 4"  (1.626 m), weight 238 lb 12.8 oz (108.319 kg).  Physical Exam  Constitutional: She is oriented to person, place, and time. She appears well-developed and well-nourished. No distress.  HENT:  Head: Normocephalic.  Cardiovascular: Normal rate.   Respiratory: Effort normal.  GI: Soft. She exhibits no distension and no mass. There is no tenderness. There is no rebound and no guarding.  Neurological: She is alert and oriented to person, place, and time.  Skin: Skin is warm and dry. No erythema.  Psychiatric: She has a normal mood and affect.   Results for orders placed or performed during the hospital encounter of 08/07/14 (from the past 24 hour(s))  Urinalysis, Routine w reflex microscopic     Status: Abnormal   Collection Time: 08/07/14  1:49 PM  Result Value Ref Range   Color, Urine YELLOW YELLOW   APPearance CLEAR CLEAR   Specific Gravity, Urine 1.020 1.005 - 1.030   pH 7.0 5.0 - 8.0   Glucose, UA NEGATIVE NEGATIVE mg/dL   Hgb urine dipstick NEGATIVE NEGATIVE   Bilirubin Urine NEGATIVE NEGATIVE   Ketones, ur NEGATIVE NEGATIVE mg/dL   Protein, ur NEGATIVE NEGATIVE mg/dL   Urobilinogen, UA 0.2 0.0 - 1.0 mg/dL   Nitrite NEGATIVE  NEGATIVE   Leukocytes, UA TRACE (A) NEGATIVE  Urine microscopic-add on     Status: Abnormal   Collection Time: 08/07/14  1:49 PM  Result Value Ref Range   Squamous Epithelial / LPF FEW (A) RARE   WBC, UA 0-2 <3 WBC/hpf    Fetal Monitoring: Baseline: 120 bpm, moderate variability, + accelerations, no decelerations Contractions: moderate UI, no contractions   MAU Course  Procedures None  MDM Discussed with Dr.Varnado. Limited OB US to evaluate placenta, 4 hours of EFM, IV LR bolus and UA today 1700 - Patient in [place]. Care turned over to [facility] [place]: D/W Dr. Simona Huh D/C home  [name], PA-C  08/07/2014, 3:02 PM  Assessment and Plan   G1 at [redacted]w[redacted]d S/P fall striking  abdomen Reactive FHR tracing and normal [place]    Medication List    TAKE these medications        MONISTAT 3 VA  Place 1 application vaginally daily.     prenatal multivitamin Tabs tablet  Take 1 tablet by mouth daily at 12 noon.     PREPARATION H EX  Apply 1 application topically at bedtime.     TYLENOL COLD MULTI-SYMPTOM PO  Take 1 tablet by mouth every 4 (four) hours as needed (cold symptoms).       Follow-up Information    Follow up with Thurnell Lose, MD.   Specialty:  Obstetrics and Gynecology   Why:  Keep your scheduled prenatal appointment   Contact information:   [place], [place]. [place] [address] [phone]

## 2014-08-07 NOTE — MAU Note (Signed)
Pt fell in parking lot at work, was stepping up to side walk, hit abdomen.  Denies abd pain, bleeding or LOF.

## 2014-08-07 NOTE — Discharge Instructions (Signed)
Fetal Movement Counts °Patient Name: __________________________________________________ Patient Due Date: ____________________ °Performing a fetal movement count is highly recommended in high-risk pregnancies, but it is good for every pregnant woman to do. Your health care provider may ask you to start counting fetal movements at 28 weeks of the pregnancy. Fetal movements often increase: °· After eating a full meal. °· After physical activity. °· After eating or drinking something sweet or cold. °· At rest. °Pay attention to when you feel the baby is most active. This will help you notice a pattern of your baby's sleep and wake cycles and what factors contribute to an increase in fetal movement. It is important to perform a fetal movement count at the same time each day when your baby is normally most active.  °HOW TO COUNT FETAL MOVEMENTS °1. Find a quiet and comfortable area to sit or lie down on your left side. Lying on your left side provides the best blood and oxygen circulation to your baby. °2. Write down the day and time on a sheet of paper or in a journal. °3. Start counting kicks, flutters, swishes, rolls, or jabs in a 2-hour period. You should feel at least 10 movements within 2 hours. °4. If you do not feel 10 movements in 2 hours, wait 2-3 hours and count again. Look for a change in the pattern or not enough counts in 2 hours. °SEEK MEDICAL CARE IF: °· You feel less than 10 counts in 2 hours, tried twice. °· There is no movement in over an hour. °· The pattern is changing or taking longer each day to reach 10 counts in 2 hours. °· You feel the baby is not moving as he or she usually does. °Date: ____________ Movements: ____________ Start time: ____________ Finish time: ____________  °Date: ____________ Movements: ____________ Start time: ____________ Finish time: ____________ °Date: ____________ Movements: ____________ Start time: ____________ Finish time: ____________ °Date: ____________ Movements:  ____________ Start time: ____________ Finish time: ____________ °Date: ____________ Movements: ____________ Start time: ____________ Finish time: ____________ °Date: ____________ Movements: ____________ Start time: ____________ Finish time: ____________ °Date: ____________ Movements: ____________ Start time: ____________ Finish time: ____________ °Date: ____________ Movements: ____________ Start time: ____________ Finish time: ____________  °Date: ____________ Movements: ____________ Start time: ____________ Finish time: ____________ °Date: ____________ Movements: ____________ Start time: ____________ Finish time: ____________ °Date: ____________ Movements: ____________ Start time: ____________ Finish time: ____________ °Date: ____________ Movements: ____________ Start time: ____________ Finish time: ____________ °Date: ____________ Movements: ____________ Start time: ____________ Finish time: ____________ °Date: ____________ Movements: ____________ Start time: ____________ Finish time: ____________ °Date: ____________ Movements: ____________ Start time: ____________ Finish time: ____________  °Date: ____________ Movements: ____________ Start time: ____________ Finish time: ____________ °Date: ____________ Movements: ____________ Start time: ____________ Finish time: ____________ °Date: ____________ Movements: ____________ Start time: ____________ Finish time: ____________ °Date: ____________ Movements: ____________ Start time: ____________ Finish time: ____________ °Date: ____________ Movements: ____________ Start time: ____________ Finish time: ____________ °Date: ____________ Movements: ____________ Start time: ____________ Finish time: ____________ °Date: ____________ Movements: ____________ Start time: ____________ Finish time: ____________  °Date: ____________ Movements: ____________ Start time: ____________ Finish time: ____________ °Date: ____________ Movements: ____________ Start time: ____________ Finish  time: ____________ °Date: ____________ Movements: ____________ Start time: ____________ Finish time: ____________ °Date: ____________ Movements: ____________ Start time: ____________ Finish time: ____________ °Date: ____________ Movements: ____________ Start time: ____________ Finish time: ____________ °Date: ____________ Movements: ____________ Start time: ____________ Finish time: ____________ °Date: ____________ Movements: ____________ Start time: ____________ Finish time: ____________  °Date: ____________ Movements: ____________ Start time: ____________ Finish   time: ____________ Date: ____________ Movements: ____________ Start time: ____________ Tiffany Solomon time: ____________ Date: ____________ Movements: ____________ Start time: ____________ Tiffany Solomon time: ____________ Date: ____________ Movements: ____________ Start time: ____________ Tiffany Solomon time: ____________ Date: ____________ Movements: ____________ Start time: ____________ Tiffany Solomon time: ____________ Date: ____________ Movements: ____________ Start time: ____________ Tiffany Solomon time: ____________ Date: ____________ Movements: ____________ Start time: ____________ Tiffany Solomon time: ____________  Date: ____________ Movements: ____________ Start time: ____________ Tiffany Solomon time: ____________ Date: ____________ Movements: ____________ Start time: ____________ Tiffany Solomon time: ____________ Date: ____________ Movements: ____________ Start time: ____________ Tiffany Solomon time: ____________ Date: ____________ Movements: ____________ Start time: ____________ Tiffany Solomon time: ____________ Date: ____________ Movements: ____________ Start time: ____________ Tiffany Solomon time: ____________ Date: ____________ Movements: ____________ Start time: ____________ Tiffany Solomon time: ____________ Date: ____________ Movements: ____________ Start time: ____________ Tiffany Solomon time: ____________  Date: ____________ Movements: ____________ Start time: ____________ Tiffany Solomon time: ____________ Date: ____________  Movements: ____________ Start time: ____________ Tiffany Solomon time: ____________ Date: ____________ Movements: ____________ Start time: ____________ Tiffany Solomon time: ____________ Date: ____________ Movements: ____________ Start time: ____________ Tiffany Solomon time: ____________ Date: ____________ Movements: ____________ Start time: ____________ Tiffany Solomon time: ____________ Date: ____________ Movements: ____________ Start time: ____________ Tiffany Solomon time: ____________ Date: ____________ Movements: ____________ Start time: ____________ Tiffany Solomon time: ____________  Date: ____________ Movements: ____________ Start time: ____________ Tiffany Solomon time: ____________ Date: ____________ Movements: ____________ Start time: ____________ Tiffany Solomon time: ____________ Date: ____________ Movements: ____________ Start time: ____________ Tiffany Solomon time: ____________ Date: ____________ Movements: ____________ Start time: ____________ Tiffany Solomon time: ____________ Date: ____________ Movements: ____________ Start time: ____________ Tiffany Solomon time: ____________ Date: ____________ Movements: ____________ Start time: ____________ Tiffany Solomon time: ____________ Document Released: 08/17/2006 Document Revised: 12/02/2013 Document Reviewed: 05/14/2012 [employer] Patient Information 2015 [employer]. This information is not intended to replace advice given to you by your health care provider. Make sure you discuss any questions you have with your health care provider.   Return for any vaginal bleeding, leaking or persistent abdominal pain

## 2014-08-07 NOTE — MAU Note (Signed)
Urine in lab 

## 2014-08-07 NOTE — MAU Note (Signed)
Patient got a second urine, sent to lab.

## 2014-08-25 ENCOUNTER — Other Ambulatory Visit: Payer: Self-pay | Admitting: Obstetrics and Gynecology

## 2014-08-25 ENCOUNTER — Encounter (HOSPITAL_COMMUNITY): Payer: Self-pay

## 2014-08-25 ENCOUNTER — Encounter (HOSPITAL_COMMUNITY)
Admit: 2014-08-25 | Discharge: 2014-08-25 | Disposition: A | Discharge status: Home / Self Care | Payer: 59 | Attending: Obstetrics and Gynecology | Admitting: Obstetrics and Gynecology

## 2014-08-25 LAB — CBC
HEMATOCRIT: 37.5 % (ref 36.0–46.0)
HEMOGLOBIN: 12.6 g/dL (ref 12.0–15.0)
MCH: 29.9 pg (ref 26.0–34.0)
MCHC: 33.6 g/dL (ref 30.0–36.0)
MCV: 88.9 fL (ref 78.0–100.0)
PLATELETS: 221 10*3/uL (ref 150–400)
RBC: 4.22 MIL/uL (ref 3.87–5.11)
RDW: 13.8 % (ref 11.5–15.5)
WBC: 8.1 10*3/uL (ref 4.0–10.5)

## 2014-08-25 NOTE — Patient Instructions (Signed)
   Your procedure is scheduled on: JAN 27 AT 70AM  Enter through the Main Entrance of [facility] at: 6AM  Pick up the phone at the desk and dial [phone] and inform us of your arrival.  Please call this number if you have any problems the morning of surgery: [phone]  Remember: Do not eat food after midnight:JAN 26 Do not drink clear liquids after: JAN 26 Take these medicines the morning of surgery with a SIP OF WATER:  Do not wear jewelry, make-up, or FINGER nail polish No metal in your hair or on your body. Do not wear lotions, powders, perfumes.  You may wear deodorant.  Do not bring valuables to the hospital. Contacts, dentures or bridgework may not be worn into surgery.  Leave suitcase in the car. After Surgery it may be brought to your room. For patients being admitted to the hospital, checkout time is 11:00am the day of discharge.    Patients discharged on the day of surgery will not be allowed to drive home.

## 2014-08-26 ENCOUNTER — Other Ambulatory Visit: Payer: Self-pay | Admitting: Obstetrics and Gynecology

## 2014-08-26 LAB — RPR: RPR Ser Ql: NONREACTIVE

## 2014-08-26 LAB — OB RESULTS CONSOLE HIV ANTIBODY (ROUTINE TESTING)
HIV: NONREACTIVE
HIV: NONREACTIVE
HIV: NONREACTIVE

## 2014-08-26 LAB — OB RESULTS CONSOLE RUBELLA ANTIBODY, IGM: Rubella: IMMUNE

## 2014-08-26 LAB — OB RESULTS CONSOLE HEPATITIS B SURFACE ANTIGEN: Hepatitis B Surface Ag: NEGATIVE

## 2014-08-26 NOTE — H&P (Signed)
Tiffany Solomon is a 29 y.o. female presenting for  Primary cesarean section at 36wks and 2 days due to h/o Myomectomy. Her EDD is 09/22/2014 based on a 6 wk ultrasound. + FM no lof no vaginal bleeding.    History OB History    Gravida Para Term Preterm AB TAB SAB Ectopic Multiple Living   1              Past Medical History  Diagnosis Date  . Fibroid    Past Surgical History  Procedure Laterality Date  . Myomectomy    . Dilation and curettage of uterus     Family History: family history is not on file. Social History:  reports that she has never smoked. She does not have any smokeless tobacco history on file. She reports that she does not drink alcohol or use illicit drugs.   Prenatal Transfer Tool  Maternal Diabetes: No Genetic Screening: Normal Maternal Ultrasounds/Referrals: Normal Fetal Ultrasounds or other Referrals:  None Maternal Substance Abuse:  No Significant Maternal Medications:  None Significant Maternal Lab Results:  None Other Comments:  None  Review of Systems  All other systems reviewed and are negative.     There were no vitals taken for this visit. Maternal Exam:  Abdomen: Fetal presentation: vertex  Introitus: Normal vulva. Normal vagina.    Fetal Exam Fetal Monitor Review: Mode: fetoscope.   Baseline rate: 145.      Physical Exam  Vitals reviewed. Constitutional: She is oriented to person, place, and time. She appears well-developed and well-nourished.  HENT:  Head: Normocephalic.  Cardiovascular: Normal rate and regular rhythm.   Respiratory: Effort normal and breath sounds normal.  GI: There is no tenderness.  Genitourinary: Vagina normal and uterus normal.  Musculoskeletal: Normal range of motion.  Neurological: She is alert and oriented to person, place, and time.  Skin: Skin is warm and dry.  Psychiatric: She has a normal mood and affect.    Prenatal labs: ABO, Rh: --/--/A POS (01/25 0845) Antibody: NEG (01/25 0845) Rubella:   Immune  RPR: Non Reactive (01/25 0845)  HBsAg:  Negative   HIV:   Negative GBS:   unknown/ pending   Assessment/Plan: 36 wks and 2 days with h/o myomectomy for primary cesarean section. R/B/A of cesarean were discussed with the patient including but not limited to infection/ bleeding damage to bowel bladder baby with the need for further surgery. R/o transfusion HIV/ HepB&C discussed. Pt voiced understanding and desires to proceed.    Tiffany Solomon J. 08/26/2014, 6:45 PM

## 2014-08-27 ENCOUNTER — Encounter (HOSPITAL_COMMUNITY)
Admission: RE | Disposition: A | Discharge status: Home / Self Care | Payer: Self-pay | Source: Ambulatory Visit | Attending: Obstetrics and Gynecology

## 2014-08-27 ENCOUNTER — Inpatient Hospital Stay (HOSPITAL_COMMUNITY)
Admission: RE | Admit: 2014-08-27 | Discharge: 2014-08-30 | DRG: 765 | Disposition: A | Discharge status: Home / Self Care | Payer: 59 | Source: Ambulatory Visit | Attending: Obstetrics and Gynecology | Admitting: Obstetrics and Gynecology

## 2014-08-27 ENCOUNTER — Encounter (HOSPITAL_COMMUNITY): Payer: Self-pay | Admitting: *Deleted

## 2014-08-27 ENCOUNTER — Inpatient Hospital Stay (HOSPITAL_COMMUNITY): Payer: 59 | Admitting: Certified Registered Nurse Anesthetist

## 2014-08-27 DIAGNOSIS — O3429 Maternal care due to uterine scar from other previous surgery: Secondary | ICD-10-CM | POA: Diagnosis present

## 2014-08-27 DIAGNOSIS — Z98891 History of uterine scar from previous surgery: Secondary | ICD-10-CM

## 2014-08-27 DIAGNOSIS — Z6841 Body Mass Index (BMI) 40.0 and over, adult: Secondary | ICD-10-CM | POA: Diagnosis not present

## 2014-08-27 DIAGNOSIS — O99824 Streptococcus B carrier state complicating childbirth: Secondary | ICD-10-CM | POA: Diagnosis present

## 2014-08-27 DIAGNOSIS — O99214 Obesity complicating childbirth: Secondary | ICD-10-CM | POA: Diagnosis present

## 2014-08-27 DIAGNOSIS — Z3A33 33 weeks gestation of pregnancy: Secondary | ICD-10-CM

## 2014-08-27 DIAGNOSIS — Z3A36 36 weeks gestation of pregnancy: Secondary | ICD-10-CM | POA: Diagnosis present

## 2014-08-27 HISTORY — DX: History of uterine scar from previous surgery: Z98.891

## 2014-08-27 LAB — PREPARE RBC (CROSSMATCH)

## 2014-08-27 LAB — GROUP B STREP BY PCR: GROUP B STREP BY PCR: POSITIVE — AB

## 2014-08-27 SURGERY — Surgical Case
Anesthesia: Epidural | Site: Abdomen

## 2014-08-27 MED ORDER — SIMETHICONE 80 MG PO CHEW
80.0000 mg | CHEWABLE_TABLET | Freq: Three times a day (TID) | ORAL | Status: DC
  Administered 2014-08-27 – 2014-08-28 (×3): 80 mg via ORAL
  Filled 2014-08-27 (×7): qty 1

## 2014-08-27 MED ORDER — ONDANSETRON HCL 4 MG/2ML IJ SOLN
INTRAMUSCULAR | Status: DC | PRN
Start: 2014-08-27 — End: 2014-08-27
  Administered 2014-08-27: 4 mg via INTRAVENOUS

## 2014-08-27 MED ORDER — CEFAZOLIN SODIUM-DEXTROSE 2-3 GM-% IV SOLR
2.0000 g | INTRAVENOUS | Status: AC
  Administered 2014-08-27: 2 g via INTRAVENOUS

## 2014-08-27 MED ORDER — NALBUPHINE HCL 10 MG/ML IJ SOLN: 5.0000 mg | Freq: Once | INTRAMUSCULAR | Status: DC | PRN

## 2014-08-27 MED ORDER — LACTATED RINGERS IV SOLN
INTRAVENOUS | Status: DC
  Administered 2014-08-27 (×3): via INTRAVENOUS

## 2014-08-27 MED ORDER — NALBUPHINE HCL 10 MG/ML IJ SOLN: 5.0000 mg | INTRAMUSCULAR | Status: DC | PRN

## 2014-08-27 MED ORDER — NALOXONE HCL 0.4 MG/ML IJ SOLN: 0.4000 mg | INTRAMUSCULAR | Status: DC | PRN

## 2014-08-27 MED ORDER — DIPHENHYDRAMINE HCL 25 MG PO CAPS
25.0000 mg | ORAL_CAPSULE | ORAL | Status: DC | PRN
  Filled 2014-08-27: qty 1

## 2014-08-27 MED ORDER — ONDANSETRON HCL 4 MG/2ML IJ SOLN: 4.0000 mg | INTRAMUSCULAR | Status: DC | PRN

## 2014-08-27 MED ORDER — SCOPOLAMINE 1 MG/3DAYS TD PT72
1.0000 | MEDICATED_PATCH | Freq: Once | TRANSDERMAL | Status: DC
  Administered 2014-08-27: 1.5 mg via TRANSDERMAL

## 2014-08-27 MED ORDER — ONDANSETRON HCL 4 MG/2ML IJ SOLN: 4.0000 mg | Freq: Three times a day (TID) | INTRAMUSCULAR | Status: DC | PRN

## 2014-08-27 MED ORDER — METHYLERGONOVINE MALEATE 0.2 MG/ML IJ SOLN
INTRAMUSCULAR | Status: DC | PRN
  Administered 2014-08-27: 0.2 mg via INTRAMUSCULAR

## 2014-08-27 MED ORDER — SCOPOLAMINE 1 MG/3DAYS TD PT72
MEDICATED_PATCH | TRANSDERMAL | Status: AC
  Administered 2014-08-27: 1.5 mg via TRANSDERMAL
  Filled 2014-08-27: qty 1

## 2014-08-27 MED ORDER — PHENYLEPHRINE HCL 10 MG/ML IJ SOLN
INTRAMUSCULAR | Status: DC | PRN
  Administered 2014-08-27: 120 ug via INTRAVENOUS

## 2014-08-27 MED ORDER — SENNOSIDES-DOCUSATE SODIUM 8.6-50 MG PO TABS
2.0000 | ORAL_TABLET | ORAL | Status: DC
  Administered 2014-08-28 (×2): 2 via ORAL
  Filled 2014-08-27 (×2): qty 2

## 2014-08-27 MED ORDER — MORPHINE SULFATE (PF) 0.5 MG/ML IJ SOLN
INTRAMUSCULAR | Status: DC | PRN
  Administered 2014-08-27: .1 mg via INTRATHECAL

## 2014-08-27 MED ORDER — DIPHENHYDRAMINE HCL 25 MG PO CAPS: 25.0000 mg | ORAL_CAPSULE | Freq: Four times a day (QID) | ORAL | Status: DC | PRN

## 2014-08-27 MED ORDER — SCOPOLAMINE 1 MG/3DAYS TD PT72: 1.0000 | MEDICATED_PATCH | Freq: Once | TRANSDERMAL | Status: DC

## 2014-08-27 MED ORDER — ZOLPIDEM TARTRATE 5 MG PO TABS: 5.0000 mg | ORAL_TABLET | Freq: Every evening | ORAL | Status: DC | PRN

## 2014-08-27 MED ORDER — LACTATED RINGERS IV SOLN
INTRAVENOUS | Status: DC
  Administered 2014-08-27 – 2014-08-28 (×2): via INTRAVENOUS

## 2014-08-27 MED ORDER — IBUPROFEN 600 MG PO TABS
600.0000 mg | ORAL_TABLET | Freq: Four times a day (QID) | ORAL | Status: DC
  Administered 2014-08-28 – 2014-08-30 (×10): 600 mg via ORAL
  Filled 2014-08-27 (×12): qty 1

## 2014-08-27 MED ORDER — ONDANSETRON HCL 4 MG/2ML IJ SOLN
INTRAMUSCULAR | Status: AC
  Filled 2014-08-27: qty 2

## 2014-08-27 MED ORDER — PHENYLEPHRINE 8 MG IN D5W 100 ML (0.08MG/ML) PREMIX OPTIME
INJECTION | INTRAVENOUS | Status: DC | PRN
  Administered 2014-08-27: 60 ug/min via INTRAVENOUS

## 2014-08-27 MED ORDER — LACTATED RINGERS IV SOLN
INTRAVENOUS | Status: DC | PRN
  Administered 2014-08-27: 08:00:00 via INTRAVENOUS

## 2014-08-27 MED ORDER — KETOROLAC TROMETHAMINE 30 MG/ML IJ SOLN
30.0000 mg | Freq: Once | INTRAMUSCULAR | Status: AC
  Administered 2014-08-27: 30 mg via INTRAVENOUS
  Filled 2014-08-27: qty 1

## 2014-08-27 MED ORDER — PRENATAL MULTIVITAMIN CH
1.0000 | ORAL_TABLET | Freq: Every day | ORAL | Status: DC
  Administered 2014-08-28 – 2014-08-30 (×3): 1 via ORAL
  Filled 2014-08-27 (×5): qty 1

## 2014-08-27 MED ORDER — METHYLERGONOVINE MALEATE 0.2 MG PO TABS: 0.2000 mg | ORAL_TABLET | ORAL | Status: DC | PRN

## 2014-08-27 MED ORDER — SIMETHICONE 80 MG PO CHEW
80.0000 mg | CHEWABLE_TABLET | ORAL | Status: DC | PRN
Start: 2014-08-27 — End: 2014-08-30

## 2014-08-27 MED ORDER — MEPERIDINE HCL 25 MG/ML IJ SOLN: 6.2500 mg | INTRAMUSCULAR | Status: DC | PRN

## 2014-08-27 MED ORDER — DIPHENHYDRAMINE HCL 50 MG/ML IJ SOLN: 12.5000 mg | INTRAMUSCULAR | Status: DC | PRN

## 2014-08-27 MED ORDER — FENTANYL CITRATE 0.05 MG/ML IJ SOLN
INTRAMUSCULAR | Status: AC
  Filled 2014-08-27: qty 2

## 2014-08-27 MED ORDER — ONDANSETRON HCL 4 MG PO TABS: 4.0000 mg | ORAL_TABLET | ORAL | Status: DC | PRN

## 2014-08-27 MED ORDER — MORPHINE SULFATE 0.5 MG/ML IJ SOLN
INTRAMUSCULAR | Status: AC
  Filled 2014-08-27: qty 10

## 2014-08-27 MED ORDER — PHENYLEPHRINE 8 MG IN D5W 100 ML (0.08MG/ML) PREMIX OPTIME
INJECTION | INTRAVENOUS | Status: AC
  Filled 2014-08-27: qty 100

## 2014-08-27 MED ORDER — METHYLERGONOVINE MALEATE 0.2 MG/ML IJ SOLN: 0.2000 mg | INTRAMUSCULAR | Status: DC | PRN

## 2014-08-27 MED ORDER — HYDROMORPHONE HCL 1 MG/ML IJ SOLN
INTRAMUSCULAR | Status: AC
  Administered 2014-08-27: 0.5 mg via INTRAVENOUS
  Filled 2014-08-27: qty 1

## 2014-08-27 MED ORDER — HYDROMORPHONE HCL 1 MG/ML IJ SOLN
0.2500 mg | INTRAMUSCULAR | Status: DC | PRN
  Administered 2014-08-27: 0.5 mg via INTRAVENOUS

## 2014-08-27 MED ORDER — FENTANYL CITRATE 0.05 MG/ML IJ SOLN
INTRAMUSCULAR | Status: DC | PRN
  Administered 2014-08-27: 25 ug via INTRATHECAL

## 2014-08-27 MED ORDER — BUPIVACAINE IN DEXTROSE 0.75-8.25 % IT SOLN
INTRATHECAL | Status: AC
  Filled 2014-08-27: qty 2

## 2014-08-27 MED ORDER — WITCH HAZEL-GLYCERIN EX PADS: 1.0000 "application " | MEDICATED_PAD | CUTANEOUS | Status: DC | PRN

## 2014-08-27 MED ORDER — LANOLIN HYDROUS EX OINT
1.0000 | TOPICAL_OINTMENT | CUTANEOUS | Status: DC | PRN
Start: 2014-08-27 — End: 2014-08-30

## 2014-08-27 MED ORDER — MENTHOL 3 MG MT LOZG: 1.0000 | LOZENGE | OROMUCOSAL | Status: DC | PRN

## 2014-08-27 MED ORDER — OXYCODONE-ACETAMINOPHEN 5-325 MG PO TABS
2.0000 | ORAL_TABLET | ORAL | Status: DC | PRN
  Filled 2014-08-27: qty 2

## 2014-08-27 MED ORDER — LACTATED RINGERS IV SOLN
Freq: Once | INTRAVENOUS | Status: AC
  Administered 2014-08-27: 06:00:00 via INTRAVENOUS

## 2014-08-27 MED ORDER — SODIUM CHLORIDE 0.9 % IJ SOLN: 3.0000 mL | INTRAMUSCULAR | Status: DC | PRN

## 2014-08-27 MED ORDER — SIMETHICONE 80 MG PO CHEW
80.0000 mg | CHEWABLE_TABLET | ORAL | Status: DC
  Administered 2014-08-28: 80 mg via ORAL
  Filled 2014-08-27 (×2): qty 1

## 2014-08-27 MED ORDER — OXYTOCIN 40 UNITS IN LACTATED RINGERS INFUSION - SIMPLE MED: 62.5000 mL/h | INTRAVENOUS | Status: AC

## 2014-08-27 MED ORDER — OXYTOCIN 40 UNITS IN LACTATED RINGERS INFUSION - SIMPLE MED
INTRAVENOUS | Status: DC | PRN
  Administered 2014-08-27: 40 [IU] via INTRAVENOUS

## 2014-08-27 MED ORDER — OXYCODONE-ACETAMINOPHEN 5-325 MG PO TABS
1.0000 | ORAL_TABLET | ORAL | Status: DC | PRN
  Administered 2014-08-27 – 2014-08-29 (×5): 1 via ORAL
  Filled 2014-08-27 (×4): qty 1

## 2014-08-27 MED ORDER — FERROUS SULFATE 325 (65 FE) MG PO TABS
325.0000 mg | ORAL_TABLET | Freq: Two times a day (BID) | ORAL | Status: DC
  Administered 2014-08-28 – 2014-08-30 (×5): 325 mg via ORAL
  Filled 2014-08-27 (×5): qty 1

## 2014-08-27 MED ORDER — DEXTROSE 5 % IV SOLN
1.0000 ug/kg/h | INTRAVENOUS | Status: DC | PRN
  Filled 2014-08-27: qty 2

## 2014-08-27 MED ORDER — DIBUCAINE 1 % RE OINT: 1.0000 "application " | TOPICAL_OINTMENT | RECTAL | Status: DC | PRN

## 2014-08-27 MED ORDER — OXYTOCIN 10 UNIT/ML IJ SOLN
INTRAMUSCULAR | Status: AC
  Filled 2014-08-27: qty 4

## 2014-08-27 MED ORDER — CEFAZOLIN SODIUM-DEXTROSE 2-3 GM-% IV SOLR
INTRAVENOUS | Status: AC
  Filled 2014-08-27: qty 50

## 2014-08-27 SURGICAL SUPPLY — 42 items
BARRIER ADHS 3X4 INTERCEED (GAUZE/BANDAGES/DRESSINGS) ×3 IMPLANT
BENZOIN TINCTURE PRP APPL 2/3 (GAUZE/BANDAGES/DRESSINGS) ×3 IMPLANT
CLAMP CORD UMBIL (MISCELLANEOUS) IMPLANT
CLOSURE WOUND 1/2 X4 (GAUZE/BANDAGES/DRESSINGS) ×1
CLOTH BEACON ORANGE TIMEOUT ST (SAFETY) ×3 IMPLANT
CONTAINER PREFILL 10% NBF 15ML (MISCELLANEOUS) IMPLANT
DRAPE SHEET LG 3/4 BI-LAMINATE (DRAPES) IMPLANT
DRSG OPSITE POSTOP 4X10 (GAUZE/BANDAGES/DRESSINGS) ×3 IMPLANT
DURAPREP 26ML APPLICATOR (WOUND CARE) ×3 IMPLANT
ELECT REM PT RETURN 9FT ADLT (ELECTROSURGICAL) ×3
ELECTRODE REM PT RTRN 9FT ADLT (ELECTROSURGICAL) ×1 IMPLANT
EXTRACTOR VACUUM M CUP 4 TUBE (SUCTIONS) IMPLANT
EXTRACTOR VACUUM M CUP 4' TUBE (SUCTIONS)
GLOVE BIOGEL M 6.5 STRL (GLOVE) ×6 IMPLANT
GLOVE BIOGEL PI IND STRL 6.5 (GLOVE) ×1 IMPLANT
GLOVE BIOGEL PI IND STRL 7.0 (GLOVE) ×2 IMPLANT
GLOVE BIOGEL PI INDICATOR 6.5 (GLOVE) ×2
GLOVE BIOGEL PI INDICATOR 7.0 (GLOVE) ×4
GOWN STRL REUS W/TWL LRG LVL3 (GOWN DISPOSABLE) ×9 IMPLANT
KIT ABG SYR 3ML LUER SLIP (SYRINGE) IMPLANT
NEEDLE HYPO 25X5/8 SAFETYGLIDE (NEEDLE) IMPLANT
NS IRRIG 1000ML POUR BTL (IV SOLUTION) ×3 IMPLANT
PACK C SECTION WH (CUSTOM PROCEDURE TRAY) ×3 IMPLANT
PAD ABD 7.5X8 STRL (GAUZE/BANDAGES/DRESSINGS) ×3 IMPLANT
PAD OB MATERNITY 4.3X12.25 (PERSONAL CARE ITEMS) ×3 IMPLANT
RTRCTR C-SECT PINK 25CM LRG (MISCELLANEOUS) IMPLANT
RTRCTR C-SECT PINK 34CM XLRG (MISCELLANEOUS) IMPLANT
SPONGE LAP 18X18 X RAY DECT (DISPOSABLE) ×3 IMPLANT
STAPLER VISISTAT 35W (STAPLE) IMPLANT
STRIP CLOSURE SKIN 1/2X4 (GAUZE/BANDAGES/DRESSINGS) ×2 IMPLANT
SUT PDS AB 0 CT1 27 (SUTURE) ×6 IMPLANT
SUT PLAIN 0 NONE (SUTURE) IMPLANT
SUT VIC AB 0 CTX 36 (SUTURE) ×6
SUT VIC AB 0 CTX36XBRD ANBCTRL (SUTURE) ×3 IMPLANT
SUT VIC AB 2-0 CT1 27 (SUTURE) ×2
SUT VIC AB 2-0 CT1 TAPERPNT 27 (SUTURE) ×1 IMPLANT
SUT VIC AB 3-0 SH 27 (SUTURE)
SUT VIC AB 3-0 SH 27X BRD (SUTURE) IMPLANT
SUT VIC AB 4-0 KS 27 (SUTURE) ×3 IMPLANT
TOWEL OR 17X24 6PK STRL BLUE (TOWEL DISPOSABLE) ×3 IMPLANT
TRAY FOLEY CATH 14FR (SET/KITS/TRAYS/PACK) ×3 IMPLANT
YANKAUER SUCT BULB TIP NO VENT (SUCTIONS) ×3 IMPLANT

## 2014-08-27 NOTE — Transfer of Care (Signed)
Immediate Anesthesia Transfer of Care Note  Patient: Tiffany Solomon  Procedure(s) Performed: Procedure(s): CESAREAN SECTION (N/A)  Patient Location: PACU  Anesthesia Type:Spinal and Epidural  Level of Consciousness: awake, alert , oriented and patient cooperative  Airway & Oxygen Therapy: Patient Spontanous Breathing  Post-op Assessment: Report given to PACU RN and Post -op Vital signs reviewed and stable  Post vital signs: Reviewed and stable  Complications: No apparent anesthesia complications

## 2014-08-27 NOTE — Anesthesia Postprocedure Evaluation (Signed)
  Anesthesia Post Note  Patient: Tiffany Solomon  Procedure(s) Performed: Procedure(s) (LRB): CESAREAN SECTION (N/A)  Anesthesia type: Epidural and sab  Patient location: PACU  Post pain: Pain level controlled  Post assessment: Post-op Vital signs reviewed  Last Vitals:  Filed Vitals:   08/27/14 0930  BP:   Pulse: 87  Temp:   Resp: 12    Post vital signs: Reviewed  Level of consciousness: awake  Complications: No apparent anesthesia complications

## 2014-08-27 NOTE — Progress Notes (Signed)
Ariel called from Lab with positive results on Group Beta Strep.  Verified GBS positive result. Heather, RN in [place] 9, Dr. Sundra Aland OR room where patient is located, was informed of GBS positive result.

## 2014-08-27 NOTE — Lactation Note (Signed)
This note was copied from the chart of Tiffany [facility]. Lactation Consultation Note  Patient Name: Tiffany Solomon Date: 08/27/2014 Reason for consult: Initial assessment;Late preterm infant Mom was not able to do STS after delivery due to baby tachypnic. Baby has been in the nursery since delivery due to tachypnea.  Basic teaching reviewed with Mom. LPT hand out given and reviewed LPT behaviors with Mom. Set up DEBP and started Mom pumping on preemie setting.  Advised to pump every 3 hours for 15 minutes. Advised when baby able to come to room to BF with feeding ques but at least every 3 hours, advised to supplement with feedings according to LPT guidelines. Mom has DEBP for home use. Lactation brochure left for review. Advised of OP services and support group. Advised to call for assist with latch and to demonstrate how to supplement when baby able to go to breast.   Maternal Data Has patient been taught Hand Expression?: Yes Does the patient have breastfeeding experience prior to this delivery?: No  Feeding    LATCH Score/Interventions                      Lactation Tools Discussed/Used Tools: Pump Breast pump type: Double-Electric Breast Pump WIC Program: Yes   Consult Status Consult Status: Follow-up Date: 08/28/14 Follow-up type: In-patient    Katrine Coho 08/27/2014, 3:12 PM

## 2014-08-27 NOTE — Anesthesia Procedure Notes (Signed)
Epidural Patient location during procedure: OR  Preanesthetic Checklist Completed: patient identified, site marked, surgical consent, pre-op evaluation, timeout performed, IV checked, risks and benefits discussed and monitors and equipment checked  Epidural Patient position: sitting Prep: site prepped and draped and DuraPrep Patient monitoring: continuous pulse ox and blood pressure Approach: midline Injection technique: LOR air  Needle:  Needle type: Tuohy  Needle gauge: 17 G Needle length: 9 cm and 9 Needle insertion depth: 7 cm Catheter type: closed end flexible Catheter size: 19 Gauge Catheter at skin depth: 14 cm Test dose: negative  Assessment Events: blood not aspirated, injection not painful, no injection resistance, negative IV test and no paresthesia  Additional Notes Spinal thru touhy 1.4 cc Bupivicaine   [id number]   mcg        100 Fentanyl mcg            25

## 2014-08-27 NOTE — Consult Note (Signed)
Neonatology Note:   Attendance at C-section:    I was asked by Dr. Landry Mellow to attend this primary C/S at 36 2/7 weeks due to previous myomectomy. The mother is a G1P0 A pos, GBS niot done with fibroids. ROM at delivery, fluid clear. Infant vigorous with good spontaneous cry and tone. Needed bulb suctioning. Ap 9/9. Lungs clear to ausc in DR. To CN to care of Pediatrician.   Real Cons, MD

## 2014-08-27 NOTE — Anesthesia Preprocedure Evaluation (Signed)
Anesthesia Evaluation  Patient identified by MRN, date of birth, ID band Patient awake    Reviewed: Allergy & Precautions, H&P , NPO status , Patient's Chart, lab work & pertinent test results  Airway Mallampati: I  TM Distance: >3 FB Neck ROM: full    Dental no notable dental hx.    Pulmonary neg pulmonary ROS,    Pulmonary exam normal       Cardiovascular negative cardio ROS      Neuro/Psych negative neurological ROS  negative psych ROS   GI/Hepatic negative GI ROS, Neg liver ROS,   Endo/Other  Morbid obesity  Renal/GU negative Renal ROS     Musculoskeletal   Abdominal (+) + obese,   Peds  Hematology negative hematology ROS (+)   Anesthesia Other Findings   Reproductive/Obstetrics (+) Pregnancy                             Anesthesia Physical Anesthesia Plan  ASA: III  Anesthesia Plan: Combined Spinal and Epidural and Epidural   Post-op Pain Management:    Induction:   Airway Management Planned:   Additional Equipment:   Intra-op Plan:   Post-operative Plan:   Informed Consent: I have reviewed the patients History and Physical, chart, labs and discussed the procedure including the risks, benefits and alternatives for the proposed anesthesia with the patient or authorized representative who has indicated his/her understanding and acceptance.     Plan Discussed with: CRNA and Anesthesiologist  Anesthesia Plan Comments:         Anesthesia Quick Evaluation

## 2014-08-27 NOTE — H&P (Signed)
Date of Initial H&P: 08/26/2014  History reviewed, patient examined, no change in status, stable for surgery.

## 2014-08-27 NOTE — Op Note (Signed)
Cesarean Section Procedure Note  Indications: previous myomectomy  Pre-operative Diagnosis: 36 week 2 day pregnancy.  Post-operative Diagnosis: same  Surgeon: Catha Brow.   Assistants: Dr. Janyth Pupa   Anesthesia: Spinal anesthesia  ASA Class: 2   Procedure Details   The patient was seen in the Holding Room. The risks, benefits, complications, treatment options, and expected outcomes were discussed with the patient.  The patient concurred with the proposed plan, giving informed consent.  The site of surgery properly noted/marked. The patient was taken to Operating Room # 9, identified as Dwain Sarna and the procedure verified as C-Section Delivery. A Time Out was held and the above information confirmed.  After induction of anesthesia, the patient was draped and prepped in the usual sterile manner. A Pfannenstiel incision was made and carried down through the subcutaneous tissue to the fascia. Fascial incision was made and extended transversely. The fascia was separated from the underlying rectus tissue superiorly and inferiorly. The peritoneum was identified and entered. Peritoneal incision was extended longitudinally. The utero-vesical peritoneal reflection was incised transversely and the bladder flap was bluntly freed from the lower uterine segment. A low transverse uterine incision was made. Delivered from cephalic presentation was a  Female with Apgar scores of 9 at one minute and 9 at five minutes. After the umbilical cord was clamped and cut cord blood was obtained for evaluation. The placenta was removed intact and appeared normal. The uterine outline, tubes and ovaries appeared normal. The uterine incision was closed with running locked sutures of 0 vicryl . A second layer of suture was used to imbricate the incision . Hemostasis was observed. Lavage was carried out until clear. Interseed was placed along the uterine incision. The fascia was then reapproximated with running  sutures of 0 pds. The skin was reapproximated with 4-0 vicryl .  Instrument, sponge, and needle counts were correct prior the abdominal closure and at the conclusion of the case.   Findings: Female infant in the cephalic presentation.. Normal fallopian tubes and ovaries.  Estimated Blood Loss:  1100 mL         Drains: foley catheter          Total IV Fluids:  Per anesthesia ml         Specimens: Placenta and sent to Labor and Delivery            Implants: None         Complications:  None; patient tolerated the procedure well.         Disposition: PACU - hemodynamically stable.         Condition: stable  Attending Attestation: I performed the procedure.

## 2014-08-28 ENCOUNTER — Encounter (HOSPITAL_COMMUNITY): Payer: Self-pay | Admitting: Obstetrics and Gynecology

## 2014-08-28 LAB — CBC
HCT: 31.7 % — ABNORMAL LOW (ref 36.0–46.0)
Hemoglobin: 10.5 g/dL — ABNORMAL LOW (ref 12.0–15.0)
MCH: 29.6 pg (ref 26.0–34.0)
MCHC: 33.1 g/dL (ref 30.0–36.0)
MCV: 89.3 fL (ref 78.0–100.0)
Platelets: 171 10*3/uL (ref 150–400)
RBC: 3.55 MIL/uL — AB (ref 3.87–5.11)
RDW: 13.6 % (ref 11.5–15.5)
WBC: 9 10*3/uL (ref 4.0–10.5)

## 2014-08-28 LAB — BIRTH TISSUE RECOVERY COLLECTION (PLACENTA DONATION)

## 2014-08-28 NOTE — Addendum Note (Signed)
Addendum  created 08/28/14 0900 by Georgeanne Nim, CRNA   Modules edited: Notes Section   Notes Section:  File: [id number]

## 2014-08-28 NOTE — Progress Notes (Addendum)
Postpartum Note Day # 1  S:  Patient resting comfortable in bed.  Pain controlled.  Tolerating general. + flatus, no BM.  Lochia moderate.  Pt has not yet ambulated.  She denies n/v/f/c, SOB, or CP.  Pt plans on breastfeeding.  O: Temp:  [97.5 F (36.4 C)-98.9 F (37.2 C)] 98.1 F (36.7 C) (01/28 0630) Pulse Rate:  [79-104] 85 (01/28 0630) Resp:  [11-21] 16 (01/28 0630) BP: (108-125)/(52-78) 120/74 mmHg (01/28 0630) SpO2:  [96 %-100 %] 99 % (01/28 0630) Weight:  [107.956 kg (238 lb)] 107.956 kg (238 lb) (01/28 0100)   Gen: A&Ox3, NAD CV: RRR, no MRG Resp: CTAB Abdomen: soft, NT, ND +BS Uterus: firm, non-tender, below umbilicus Incision: c/d/i, bandage on Ext: No edema, no calf tenderness bilaterally, SCDs in place  Labs:  Recent Labs  08/25/14 0845 08/28/14 0530  HGB 12.6 10.5*    A/P: Pt is a 29 y.o. G1P0101 s/p primary C-section due to prior myomectomy, POD#1  - Pain well controlled -GU: UOP is adequate -GI: Tolerating general diet -Activity: encouraged sitting up to chair and ambulation as tolerated -Prophylaxis: SCDs while in bed, encourage ambulation -Heme: Delivery c/b PPH, Hgb stable as above, VSS and pt asymptomatic, continue iron twice daily  Continue routine postoperative care.   Janyth Pupa, DO [phone] (pager) [phone] (office)

## 2014-08-28 NOTE — Lactation Note (Signed)
This note was copied from the chart of Tiffany [facility]. Lactation Consultation Note; Assisted mom with latch. Baby awake and rooting. Assisted mom in football hold and baby nursed for 20 min, Used feeding tube/syringe and baby took 3 cc's formula then came off the breast and would not latch again. Mom states she feels comfortable with the feeding tube/syringe to supplement at the breast. Encouraged to pump q 3 hours to promote milk supply No questions at present.To call for assist prn  Patient Name: Tiffany Solomon [id number] Date: 08/28/2014 Reason for consult: Follow-up assessment;Late preterm infant   Maternal Data Formula Feeding for Exclusion: No Has patient been taught Hand Expression?: Yes Does the patient have breastfeeding experience prior to this delivery?: No  Feeding Feeding Type: Breast Fed Length of feed: 20 min  LATCH Score/Interventions Latch: Grasps breast easily, tongue down, lips flanged, rhythmical sucking.  Audible Swallowing: A few with stimulation  Type of Nipple: Everted at rest and after stimulation  Comfort (Breast/Nipple): Soft / non-tender     Hold (Positioning): Assistance needed to correctly position infant at breast and maintain latch. Intervention(s): Breastfeeding basics reviewed;Support Pillows;Skin to skin  LATCH Score: 8  Lactation Tools Discussed/Used     Consult Status Consult Status: Follow-up Date: 08/29/14 Follow-up type: In-patient    Truddie Crumble 08/28/2014, 11:56 AM

## 2014-08-28 NOTE — Lactation Note (Signed)
This note was copied from the chart of Girl [facility]. Lactation Consultation Note  Patient Name: Girl Arrayah Connors [id number] Date: 08/28/2014 Reason for consult: Follow-up assessment;Difficult latch;Breast/nipple pain LC assisted mom with latching baby in football position on her right breast.  Mom has hx of carpal tunnel and finds it difficult to latch baby by herself, as baby has needed some formula via syringe flowing to ensure sustained latch and sucking bursts.  Candelaria prepares some alimentum in curved-tip syringe and initially baby latches without any formula but tends to pinch mom's nipple.  LC suggested offering a few drops of formula on baby's tongue and after that, she opens wider and with brief chin tug, sustaines latch and rhythmical sucking for 10 minutes with only need for a few more drops of formula dripped into corner of baby's mouth to widen her jaw excursions.  Baby slipped off after 10 minutes, but re-latched without any "tease" of formula.  Mom able to remove her hand from breast support to relax that hand and she has pillows supporting her and baby.  Two female friends at bedside but FOB will be helping at home so will need to be shown how to assist at a later feeding.  LC reviewed plan: feed at least 10 minutes on at least 1 breast at least q3h and more often, if baby cuing.  Use ebm or formula to encourage baby to open wide and ask for family member or nurse to assist briefly at beginning of feeding until rhythmical and comfortable sucking bursts seen. Mom to pump with DEBP 15 minutes any time more than a few drops of formula needed.   Maternal Data    Feeding Feeding Type: Breast Fed Length of feed: 10 min  LATCH Score/Interventions Latch: Grasps breast easily, tongue down, lips flanged, rhythmical sucking. Intervention(s): Adjust position;Assist with latch;Breast compression (drops of alimentum for wider latch and jaw excursions)  Audible Swallowing: Spontaneous and  intermittent Intervention(s): Skin to skin;Hand expression Intervention(s): Skin to skin;Hand expression;Alternate breast massage  Type of Nipple: Everted at rest and after stimulation Intervention(s): Double electric pump  Comfort (Breast/Nipple): Soft / non-tender (no pain when baby latches with wide areolar grasp)     Hold (Positioning): Assistance needed to correctly position infant at breast and maintain latch. Intervention(s): Breastfeeding basics reviewed;Support Pillows;Position options;Skin to skin (chin tug, drops of ebm or formula to encourage wide open mouth)  LATCH Score: 9 (LC assisted and observed)  Lactation Tools Discussed/Used Tools: Other (comment) (a few drops of alimentum via curved-tip syringe at breast) Signs of proper latch  Consult Status Consult Status: Follow-up Date: 08/22/14 Follow-up type: In-patient    Junious Dresser [facility] 08/28/2014, 5:22 PM

## 2014-08-28 NOTE — Anesthesia Postprocedure Evaluation (Signed)
  Anesthesia Post-op Note  Patient: Tiffany Solomon  Procedure(s) Performed: Procedure(s): CESAREAN SECTION (N/A)  Patient Location: Mother/Baby  Anesthesia Type:Spinal  Level of Consciousness: awake, alert , oriented and patient cooperative  Airway and Oxygen Therapy: Patient Spontanous Breathing  Post-op Pain: mild  Post-op Assessment: Patient's Cardiovascular Status Stable, Respiratory Function Stable, No headache, No backache, No residual numbness and No residual motor weakness  Post-op Vital Signs: stable  Last Vitals:  Filed Vitals:   08/28/14 0630  BP: 120/74  Pulse: 85  Temp: 36.7 C  Resp: 16    Complications: No apparent anesthesia complications

## 2014-08-29 LAB — TYPE AND SCREEN
ABO/RH(D): A POS
ANTIBODY SCREEN: NEGATIVE
Unit division: 0
Unit division: 0

## 2014-08-29 NOTE — Progress Notes (Signed)
Provided pt with comfort gels for c/o sore nipples. No cracking or bleeding noted. Mom would like to see lactation again before she is discharged. Baby needs chin tug to help mouth open wide enough for deep latch. Mother is concerned that the baby is not getting enough breast milk from her and states that she is becoming frustrated with feedings. Emotional support provided and education provided about supply/demand and time that it takes for milk to come in. Wille Celeste

## 2014-08-29 NOTE — Progress Notes (Signed)
Subjective: Postpartum Day 2: Cesarean Delivery Patient reports tolerating PO, + flatus and no problems voiding.    Objective: Vital signs in last 24 hours: Temp:  [98 F (36.7 C)-98.2 F (36.8 C)] 98 F (36.7 C) (01/29 0548) Pulse Rate:  [89-95] 89 (01/29 0548) Resp:  [18-20] 20 (01/29 0548) BP: (129-139)/(79-83) 139/83 mmHg (01/29 0548)  Physical Exam:  General: alert and cooperative Lochia: appropriate Uterine Fundus: firm Incision: bandage clean dry intact  DVT Evaluation: No evidence of DVT seen on physical exam.   Recent Labs  08/28/14 0530  HGB 10.5*  HCT 31.7*    Assessment/Plan: Status post Cesarean section. Doing well postoperatively.  Continue current care  encourage ambulation Pt may shower . She is instructed to remove her bandage while in the shower. Plan discharge home tomorrow .  Goble Fudala J. 08/29/2014, 8:00 AM

## 2014-08-30 MED ORDER — OXYCODONE-ACETAMINOPHEN 5-325 MG PO TABS: 1.0000 | ORAL_TABLET | Freq: Four times a day (QID) | ORAL | Status: DC | PRN

## 2014-08-30 MED ORDER — IBUPROFEN 600 MG PO TABS: 600.0000 mg | ORAL_TABLET | Freq: Four times a day (QID) | ORAL | Status: DC | PRN

## 2014-08-30 NOTE — Discharge Summary (Signed)
Obstetric Discharge Summary Reason for Admission: cesarean section Prenatal Procedures: none Intrapartum Procedures: cesarean: low cervical, transverse Postpartum Procedures: none Complications-Operative and Postpartum: none HEMOGLOBIN  Date Value Ref Range Status  08/28/2014 10.5* 12.0 - 15.0 g/dL Final   HCT  Date Value Ref Range Status  08/28/2014 31.7* 36.0 - 46.0 % Final    Physical Exam:  General: alert and cooperative Lochia: appropriate Uterine Fundus: firm Incision: healing well DVT Evaluation: No evidence of DVT seen on physical exam.  Discharge Diagnoses: Term Pregnancy-delivered  Discharge Information: Date: 08/30/2014 Activity: pelvic rest Diet: routine Medications: PNV, Ibuprofen and Percocet Condition: stable Instructions: refer to practice specific booklet Discharge to: home Follow-up Information    Follow up with Catha Brow., MD In 2 weeks.   Specialty:  Obstetrics and Gynecology   Why:  incision check    Contact information:   [address] [place] [address] [phone]       Newborn Data: Live born female  Birth Weight: 7 lb 13.2 oz (3549 g) APGAR: 9, 9  Home with mother.  Elaysia Devargas J. 08/30/2014, 10:03 AM

## 2014-08-30 NOTE — Lactation Note (Signed)
This note was copied from the chart of Tiffany [facility]. Lactation Consultation Note  Baby has continued to slip off breast frequently during feeding.  Mom is pumping with symphony pump every 3 hours but not obtaining milk so supplements with formula per bottle.  Assisted with postioning baby in cross cradle hold.  Nipple erect but areolar tissue is edematous and firm.  Attempted hand expression but no colostrum seen.  Mom c/o discomfort with hand expression.  Baby latches for under one minute before slipping off.  24 mm nipple shield applied and baby was able to sustain latch and nursed actively.  Milk in shield when baby came off.  Plan for discharge is to put baby to breast using nipple shield with feeding cues, pump after feedings for 15 minutes then supplement EBM/formula per volume parameters.  Reviewed signs of breasts filling and engorgement prevention and treatment.  Instructed on keeping a feeding diary the first week home.  Recommended follow up outpatient appointment and mom will call prn.  Encouraged support groups.  Patient Name: Tiffany Solomon [id number] Date: 08/30/2014 Reason for consult: Follow-up assessment;Difficult latch;Late preterm infant   Maternal Data Has patient been taught Hand Expression?: Yes Does the patient have breastfeeding experience prior to this delivery?: No  Feeding Feeding Type: Breast Fed Nipple Type: Slow - flow Length of feed: 20 min  LATCH Score/Interventions Latch: Grasps breast easily, tongue down, lips flanged, rhythmical sucking. (with 24 mm nipple shield) Intervention(s): Adjust position;Assist with latch;Breast massage;Breast compression  Audible Swallowing: A few with stimulation Intervention(s): Skin to skin;Hand expression Intervention(s): Skin to skin;Hand expression;Alternate breast massage  Type of Nipple: Everted at rest and after stimulation (areolar edema) Intervention(s): Double electric pump  Comfort (Breast/Nipple): Soft /  non-tender     Hold (Positioning): Assistance needed to correctly position infant at breast and maintain latch. Intervention(s): Breastfeeding basics reviewed;Support Pillows;Position options;Skin to skin  LATCH Score: 8  Lactation Tools Discussed/Used     Consult Status Consult Status: Complete    Ave Filter 08/30/2014, 10:34 AM

## 2015-07-28 ENCOUNTER — Other Ambulatory Visit (HOSPITAL_COMMUNITY)
Admission: RE | Admit: 2015-07-28 | Discharge: 2015-07-28 | Disposition: A | Discharge status: Home / Self Care | Payer: 59 | Source: Ambulatory Visit | Attending: Obstetrics and Gynecology | Admitting: Obstetrics and Gynecology

## 2015-07-28 ENCOUNTER — Other Ambulatory Visit: Payer: Self-pay | Admitting: Obstetrics and Gynecology

## 2015-07-28 DIAGNOSIS — Z1151 Encounter for screening for human papillomavirus (HPV): Secondary | ICD-10-CM | POA: Insufficient documentation

## 2015-07-28 DIAGNOSIS — R87810 Cervical high risk human papillomavirus (HPV) DNA test positive: Secondary | ICD-10-CM | POA: Diagnosis present

## 2015-07-28 DIAGNOSIS — Z01419 Encounter for gynecological examination (general) (routine) without abnormal findings: Secondary | ICD-10-CM | POA: Diagnosis present

## 2015-07-31 LAB — CYTOLOGY - PAP

## 2015-10-22 LAB — OB RESULTS CONSOLE HEPATITIS B SURFACE ANTIGEN: HEP B S AG: NEGATIVE

## 2015-10-22 LAB — OB RESULTS CONSOLE GC/CHLAMYDIA
Chlamydia: NEGATIVE
Gonorrhea: NEGATIVE

## 2015-10-22 LAB — OB RESULTS CONSOLE RPR: RPR: NONREACTIVE

## 2015-10-22 LAB — OB RESULTS CONSOLE RUBELLA ANTIBODY, IGM: RUBELLA: IMMUNE

## 2015-10-22 LAB — OB RESULTS CONSOLE ABO/RH: RH TYPE: POSITIVE

## 2015-10-22 LAB — OB RESULTS CONSOLE HIV ANTIBODY (ROUTINE TESTING): HIV: NONREACTIVE

## 2015-10-22 LAB — OB RESULTS CONSOLE ANTIBODY SCREEN: ANTIBODY SCREEN: NEGATIVE

## 2016-01-08 ENCOUNTER — Encounter (HOSPITAL_COMMUNITY): Payer: Self-pay | Admitting: *Deleted

## 2016-01-08 ENCOUNTER — Inpatient Hospital Stay (HOSPITAL_COMMUNITY)
Admission: AD | Admit: 2016-01-08 | Discharge: 2016-01-08 | Disposition: A | Discharge status: Home / Self Care | Payer: 59 | Source: Ambulatory Visit | Attending: Obstetrics and Gynecology | Admitting: Obstetrics and Gynecology

## 2016-01-08 DIAGNOSIS — R5383 Other fatigue: Secondary | ICD-10-CM

## 2016-01-08 DIAGNOSIS — Z3A16 16 weeks gestation of pregnancy: Secondary | ICD-10-CM | POA: Insufficient documentation

## 2016-01-08 DIAGNOSIS — O26892 Other specified pregnancy related conditions, second trimester: Secondary | ICD-10-CM | POA: Diagnosis not present

## 2016-01-08 DIAGNOSIS — G9331 Postviral fatigue syndrome: Secondary | ICD-10-CM

## 2016-01-08 DIAGNOSIS — O26899 Other specified pregnancy related conditions, unspecified trimester: Secondary | ICD-10-CM

## 2016-01-08 DIAGNOSIS — N949 Unspecified condition associated with female genital organs and menstrual cycle: Secondary | ICD-10-CM

## 2016-01-08 DIAGNOSIS — Z8249 Family history of ischemic heart disease and other diseases of the circulatory system: Secondary | ICD-10-CM | POA: Insufficient documentation

## 2016-01-08 DIAGNOSIS — O9989 Other specified diseases and conditions complicating pregnancy, childbirth and the puerperium: Secondary | ICD-10-CM

## 2016-01-08 DIAGNOSIS — R0602 Shortness of breath: Secondary | ICD-10-CM | POA: Insufficient documentation

## 2016-01-08 DIAGNOSIS — R109 Unspecified abdominal pain: Secondary | ICD-10-CM | POA: Insufficient documentation

## 2016-01-08 DIAGNOSIS — G933 Postviral fatigue syndrome: Secondary | ICD-10-CM

## 2016-01-08 HISTORY — DX: Anemia, unspecified: D64.9

## 2016-01-08 HISTORY — DX: Depression, unspecified: F32.A

## 2016-01-08 HISTORY — DX: Major depressive disorder, single episode, unspecified: F32.9

## 2016-01-08 LAB — URINALYSIS, ROUTINE W REFLEX MICROSCOPIC
Bilirubin Urine: NEGATIVE
Glucose, UA: NEGATIVE mg/dL
Hgb urine dipstick: NEGATIVE
Ketones, ur: NEGATIVE mg/dL
Leukocytes, UA: NEGATIVE
NITRITE: NEGATIVE
Protein, ur: NEGATIVE mg/dL
SPECIFIC GRAVITY, URINE: 1.01 (ref 1.005–1.030)
pH: 7.5 (ref 5.0–8.0)

## 2016-01-08 MED ORDER — IPRATROPIUM BROMIDE 0.02 % IN SOLN
0.5000 mg | Freq: Once | RESPIRATORY_TRACT | Status: AC
  Administered 2016-01-08: 0.5 mg via RESPIRATORY_TRACT
  Filled 2016-01-08: qty 2.5

## 2016-01-08 MED ORDER — ALBUTEROL SULFATE (2.5 MG/3ML) 0.083% IN NEBU
2.5000 mg | INHALATION_SOLUTION | Freq: Once | RESPIRATORY_TRACT | Status: AC
Start: 2016-01-08 — End: 2016-01-08
  Administered 2016-01-08: 2.5 mg via RESPIRATORY_TRACT
  Filled 2016-01-08: qty 3

## 2016-01-08 NOTE — MAU Note (Signed)
Pt having twins, has been SOB before, is more severe today - SOB without exertion. No hx of asthma.  Had cold & cough last week, was worse then.  C/O sharp lower abd pain - was told it is probably RLQ.  Denies bleeding or LOF.

## 2016-01-08 NOTE — MAU Provider Note (Signed)
History    CSN: [id number] Arrival date and time: 01/08/16 1105 First Provider Initiated Contact with Patient 01/08/16 1120    Chief Complaint  Patient presents with  . Shortness of Breath  . Abdominal Pain   HPI Patient is 30 y.o. [id number] [redacted]w[redacted]d here with complaints of abdominal pain and SOB  #Shortness of breath: present for whole pregnancy per patient. She reports worsening that lat 3 weeks. She endorses a viral URI 1 week ago. She reports SOB worsened after this illness. She does endorse worsening last night. She has associated trouble talking and palpitations.  Reports she has been sleeping on pillows for the entire pregnancy. She reports she is able to lie flat but does not like this 2/2 to back pain but not due to SOB. She denies swelling in legs, denies warm/red lower extremity. Denies chest pain, denies pain with deep inspiration. Denies history of asthma or tobacco use.  Maternal aunt with CHF in late 40s/early 61s but reports she also had OSA/T2DM/HTN/obesity.  #Abdominal pain: intermittent in nature. Starts at hips and radiates to pelvis. Associated with movement and standing.   +FM, denies LOF, VB, contractions, vaginal discharge.   OB History    Gravida Para Term Preterm AB TAB SAB Ectopic Multiple Living   2 1  1      0 1      Past Medical History  Diagnosis Date  . Fibroid   . Anemia   . Depression     Past Surgical History  Procedure Laterality Date  . Myomectomy    . Dilation and curettage of uterus    . Cesarean section N/A 08/27/2014    Procedure: CESAREAN SECTION;  Surgeon: Maeola Sarah. Landry Mellow, MD;  Location: [facility];  Service: Obstetrics;  Laterality: N/A;  . Root canal      Family History  Problem Relation Age of Onset  . Brain cancer Mother   . Heart disease Maternal Aunt   . Hypertension Maternal Aunt     Social History  Substance Use Topics  . Smoking status: Never Smoker   . Smokeless tobacco: None  . Alcohol Use: No    Allergies: No Known  Allergies  Prescriptions prior to admission  Medication Sig Dispense Refill Last Dose  . acetaminophen (TYLENOL) 500 MG tablet Take 1,000 mg by mouth every 6 (six) hours as needed for mild pain or headache.   01/07/2016 at Unknown time  . Doxylamine-Pyridoxine (DICLEGIS) 10-10 MG TBEC Take 2 tablets by mouth at bedtime as needed (for nausea).   Past Month at Unknown time  . guaiFENesin (ROBITUSSIN) 100 MG/5ML liquid Take 800 mg by mouth 3 (three) times daily as needed for cough.   Past Week at Unknown time  . mineral oil liquid Take 15 mLs by mouth daily as needed for mild constipation or moderate constipation.   Past Week at Unknown time  . Prenatal Vit-Fe Fumarate-FA (PRENATAL MULTIVITAMIN) TABS tablet Take 1 tablet by mouth at bedtime.    01/07/2016 at Unknown time  . sodium chloride (OCEAN) 0.65 % SOLN nasal spray Place 1 spray into both nostrils as needed for congestion.   Past Week at Unknown time  . [DISCONTINUED] ibuprofen (ADVIL,MOTRIN) 600 MG tablet Take 1 tablet (600 mg total) by mouth every 6 (six) hours as needed. 30 tablet 0   . [DISCONTINUED] oxyCODONE-acetaminophen (PERCOCET/ROXICET) 5-325 MG per tablet Take 1-2 tablets by mouth every 6 (six) hours as needed (for pain scale equal to or greater than 7). 30 tablet  0     Review of Systems  Constitutional: Negative for fever and chills.  Eyes: Negative for blurred vision and double vision.  Respiratory: Negative for cough and shortness of breath.   Cardiovascular: Negative for chest pain and orthopnea.  Gastrointestinal: Negative for nausea and vomiting.  Genitourinary: Negative for dysuria, frequency and flank pain.  Musculoskeletal: Negative for myalgias.  Skin: Negative for rash.  Neurological: Negative for dizziness, tingling, weakness and headaches.  Endo/Heme/Allergies: Does not bruise/bleed easily.  Psychiatric/Behavioral: Negative for depression and suicidal ideas. The patient is not nervous/anxious.    Physical Exam    Blood pressure 116/70, pulse 108, temperature 98.3 F (36.8 C), temperature source Oral, resp. rate 18, SpO2 98 %, unknown if currently breastfeeding.  Physical Exam  Nursing note and vitals reviewed. Constitutional: She is oriented to person, place, and time. She appears well-developed and well-nourished. No distress.  Pregnant female. Speaking complete sentences, somewhat breathless at times but this is intermittent during conversation.   HENT:  Head: Normocephalic and atraumatic.  Eyes: Conjunctivae are normal. No scleral icterus.  Neck: Normal range of motion. Neck supple.  Cardiovascular: Normal rate and intact distal pulses.   Respiratory: Effort normal. No respiratory distress. She has no wheezes. She has no rales. She exhibits no tenderness.  Moderate but decreased air movement.  Patient with relatively good air movement in all lung fields. No crackles on exam  GI: Soft. There is no tenderness. There is no rebound and no guarding.  Gravid  Genitourinary: Vagina normal.  Musculoskeletal: Normal range of motion. She exhibits no edema.  Negative homan's. No cords.   Neurological: She is alert and oriented to person, place, and time.  Skin: Skin is warm and dry. No rash noted.  Psychiatric: She has a normal mood and affect.    MAU Course  Procedures  MDM Results for orders placed or performed during the hospital encounter of 01/08/16 (from the past 24 hour(s))  Urinalysis, Routine w reflex microscopic (not at [facility])     Status: Abnormal   Collection Time: 01/08/16 11:08 AM  Result Value Ref Range   Color, Urine YELLOW YELLOW   APPearance CLOUDY (A) CLEAR   Specific Gravity, Urine 1.010 1.005 - 1.030   pH 7.5 5.0 - 8.0   Glucose, UA NEGATIVE NEGATIVE mg/dL   Hgb urine dipstick NEGATIVE NEGATIVE   Bilirubin Urine NEGATIVE NEGATIVE   Ketones, ur NEGATIVE NEGATIVE mg/dL   Protein, ur NEGATIVE NEGATIVE mg/dL   Nitrite NEGATIVE NEGATIVE   Leukocytes, UA NEGATIVE NEGATIVE     1:50 PM evaluated patient after albuterol and ipratroprium treatment improved sx.   Assessment and Plan   Tiffany Solomon is a 30 y.o. G2P0101 at [redacted]w[redacted]d   #Abdominal pain- Consistent with Round ligament pain  #SOB: ddx includes normal variant. Unlikely PE given no hypoxia, mild tachycardia (wnl for pregnancy) and no s/sx of VTE on exam. Unlikely asthma as patient has no history.  Low liklihood of PPCM given normal pulse ox and no signs of fluid overload. Likely post viral syndrome. - breathing treatment improved sx. - Discussed sx treatment for URI with mucinex - Reviewed return precautions - Discussed pathophysiology of post viral syndrome and resolution in 4-6 weeks. If cough is chronic or sx worsen, consider echo in outpatient setting.   Juanita Craver Lewis Keats 01/08/2016, 1:50 PM

## 2016-01-08 NOTE — Discharge Instructions (Signed)
Round Ligament Pain The round ligament is a cord of muscle and tissue that helps to support the uterus. It can become a source of pain during pregnancy if it becomes stretched or twisted as the baby grows. The pain usually begins in the second trimester of pregnancy, and it can come and go until the baby is delivered. It is not a serious problem, and it does not cause harm to the baby. Round ligament pain is usually a short, sharp, and pinching pain, but it can also be a dull, lingering, and aching pain. The pain is felt in the lower side of the abdomen or in the groin. It usually starts deep in the groin and moves up to the outside of the hip area. Pain can occur with:  A sudden change in position.  Rolling over in bed.  Coughing or sneezing.  Physical activity. HOME CARE INSTRUCTIONS Watch your condition for any changes. Take these steps to help with your pain:  When the pain starts, relax. Then try:  Sitting down.  Flexing your knees up to your abdomen.  Lying on your side with one pillow under your abdomen and another pillow between your legs.  Sitting in a warm bath for 15-20 minutes or until the pain goes away.  Take over-the-counter and prescription medicines only as told by your health care provider.  Move slowly when you sit and stand.  Avoid long walks if they cause pain.  Stop or lessen your physical activities if they cause pain. SEEK MEDICAL CARE IF:  Your pain does not go away with treatment.  You feel pain in your back that you did not have before.  Your medicine is not helping. SEEK IMMEDIATE MEDICAL CARE IF:  You develop a fever or chills.  You develop uterine contractions.  You develop vaginal bleeding.  You develop nausea or vomiting.  You develop diarrhea.  You have pain when you urinate.   This information is not intended to replace advice given to you by your health care provider. Make sure you discuss any questions you have with your health  care provider.   Document Released: 04/26/2008 Document Revised: 10/10/2011 Document Reviewed: 09/24/2014 Elsevier Interactive Patient Education [employer].

## 2016-01-27 ENCOUNTER — Encounter (HOSPITAL_COMMUNITY): Payer: Self-pay | Admitting: Obstetrics and Gynecology

## 2016-01-29 ENCOUNTER — Ambulatory Visit (HOSPITAL_COMMUNITY): Payer: 59

## 2016-02-05 ENCOUNTER — Encounter (HOSPITAL_COMMUNITY): Payer: Self-pay

## 2016-02-05 ENCOUNTER — Ambulatory Visit (HOSPITAL_COMMUNITY)
Admission: RE | Admit: 2016-02-05 | Discharge: 2016-02-05 | Disposition: A | Discharge status: Home / Self Care | Payer: 59 | Source: Ambulatory Visit | Attending: Obstetrics and Gynecology | Admitting: Obstetrics and Gynecology

## 2016-02-05 DIAGNOSIS — Z87898 Personal history of other specified conditions: Secondary | ICD-10-CM | POA: Insufficient documentation

## 2016-02-05 DIAGNOSIS — O210 Mild hyperemesis gravidarum: Secondary | ICD-10-CM | POA: Diagnosis not present

## 2016-02-05 DIAGNOSIS — O99342 Other mental disorders complicating pregnancy, second trimester: Secondary | ICD-10-CM | POA: Insufficient documentation

## 2016-02-05 DIAGNOSIS — F419 Anxiety disorder, unspecified: Secondary | ICD-10-CM | POA: Insufficient documentation

## 2016-02-05 DIAGNOSIS — Z3A20 20 weeks gestation of pregnancy: Secondary | ICD-10-CM | POA: Diagnosis not present

## 2016-02-05 DIAGNOSIS — Z9889 Other specified postprocedural states: Secondary | ICD-10-CM | POA: Diagnosis not present

## 2016-02-05 DIAGNOSIS — O30003 Twin pregnancy, unspecified number of placenta and unspecified number of amniotic sacs, third trimester: Secondary | ICD-10-CM | POA: Diagnosis present

## 2016-02-05 NOTE — Consult Note (Signed)
Maternal Fetal Medicine Consultation  Requesting Provider(s): Christophe Louis, MD  Reason for consultation: DC/DA twin gestation, prior history of myomectomy  HPI: Tiffany Solomon is a 30 yo G3P0111, EDD 06/18/2016 who is currently at 29w 6d seen for consultation due to DC/DA twin gestation and prior history of myomectomy.  Tiffany Solomon reports a history of myomectomy in 2014 in [place] (operative note not immediately available).  She reports that 5 myomas were excised with one "very large" myoma.  She underwent a scheduled C-section at 36-37 weeks in [id number] without complications.  She was diagnosed with DC/DA twin gestation  - seen today to discuss timing of delivery given prior history of myomectomy, C-section and now twin gestation.  Tiffany Solomon reports that her pregnancy has been uncomplicated except for hyperemesis symptoms and anxiety.  She is not currently taking any anxiolytic medications but was previously offered treatment.  Per report, her prenatal ultrasounds have been normal.  She is without complaints today.  OB History: OB History    Gravida Para Term Preterm AB TAB SAB Ectopic Multiple Living   2 1  1      0 1      PMH:  Past Medical History  Diagnosis Date  . Fibroid   . Anemia   . Depression     PSH:  Past Surgical History  Procedure Laterality Date  . Myomectomy    . Dilation and curettage of uterus    . Cesarean section N/A 08/27/2014    Procedure: CESAREAN SECTION;  Surgeon: Maeola Sarah. Landry Mellow, MD;  Location: [facility];  Service: Obstetrics;  Laterality: N/A;  . Root canal     Meds:  Current Outpatient Prescriptions on File Prior to Encounter  Medication Sig Dispense Refill  . acetaminophen (TYLENOL) 500 MG tablet Take 1,000 mg by mouth every 6 (six) hours as needed for mild pain or headache.    . Prenatal Vit-Fe Fumarate-FA (PRENATAL MULTIVITAMIN) TABS tablet Take 1 tablet by mouth at bedtime.     . Doxylamine-Pyridoxine (DICLEGIS) 10-10 MG TBEC Take 2 tablets by mouth at  bedtime as needed (for nausea). Reported on 02/05/2016    . guaiFENesin (ROBITUSSIN) 100 MG/5ML liquid Take 800 mg by mouth 3 (three) times daily as needed for cough. Reported on 02/05/2016    . mineral oil liquid Take 15 mLs by mouth daily as needed for mild constipation or moderate constipation. Reported on 02/05/2016    . sodium chloride (OCEAN) 0.65 % SOLN nasal spray Place 1 spray into both nostrils as needed for congestion. Reported on 02/05/2016     No current facility-administered medications on file prior to encounter.   Allergies: No Known Allergies   FH:  Family History  Problem Relation Age of Onset  . Brain cancer Mother   . Heart disease Maternal Aunt   . Hypertension Maternal Aunt   Denies known history of birth defects or hereditary disorders  Soc:  Social History   Social History  . Marital Status: Single    Spouse Name: N/A  . Number of Children: N/A  . Years of Education: N/A   Occupational History  . Not on file.   Social History Main Topics  . Smoking status: Never Smoker   . Smokeless tobacco: Not on file  . Alcohol Use: No  . Drug Use: No  . Sexual Activity: Not on file   Other Topics Concern  . Not on file   Social History Narrative    Review of Systems: no vaginal  bleeding or cramping/contractions, no LOF, no nausea/vomiting. All other systems reviewed and are negative.  PE:   Filed Vitals:   02/05/16 0800  BP: 113/70  Pulse: 119    A/P: 1) DC/DA twin gestation at 20w 6d - management per primary OB team.  Recommend serial growth scans every 4 weeks. If growth is appropriate and there are no other indications for antenatal testing, I do not normally recommend NSTs for uncomplicated DC/DA twin gestations.  2) History of previous myomectomy and 36 week scheduled C-section - while there are no specific guidelines on the timing of delivery for twin gestation and prior myomectomy, feel that it would be reasonable to plan a scheduled C-section again at  36 weeks.  I would offer a course of late preterm betamethasone some 48-72 hours prior to the scheduled C-section.  In the event that the patient goes into labor prior to that time, would plan Cesarean delivery at that point in time.  I realize that this will ultimately be a judgement call - but if the patient has painful uterine contractions and documented cervical change feel that this is sufficient to justify delivery prior to 36 weeks.   3) History of anxiety - we briefly discussed risks / benefits of SSRI - would feel comfortable starting medications if clinically indicated or appropriate referrals to mental health as needed.    Thank you for the opportunity to be a part of the care of [facility]. Please contact our office if we can be of further assistance.   I spent approximately 30 minutes with this patient with over 50% of time spent in face-to-face counseling.  Benjaman Lobe, MD Maternal Fetal Medicine

## 2016-02-17 ENCOUNTER — Inpatient Hospital Stay (HOSPITAL_COMMUNITY)
Admission: AD | Admit: 2016-02-17 | Discharge: 2016-02-18 | Disposition: A | Discharge status: Home / Self Care | Payer: Medicaid Other | Source: Ambulatory Visit | Attending: Obstetrics & Gynecology | Admitting: Obstetrics & Gynecology

## 2016-02-17 ENCOUNTER — Encounter (HOSPITAL_COMMUNITY): Payer: Self-pay

## 2016-02-17 DIAGNOSIS — O30002 Twin pregnancy, unspecified number of placenta and unspecified number of amniotic sacs, second trimester: Secondary | ICD-10-CM | POA: Diagnosis not present

## 2016-02-17 DIAGNOSIS — O26892 Other specified pregnancy related conditions, second trimester: Secondary | ICD-10-CM | POA: Insufficient documentation

## 2016-02-17 DIAGNOSIS — Z3A22 22 weeks gestation of pregnancy: Secondary | ICD-10-CM | POA: Insufficient documentation

## 2016-02-17 DIAGNOSIS — O99282 Endocrine, nutritional and metabolic diseases complicating pregnancy, second trimester: Secondary | ICD-10-CM | POA: Diagnosis not present

## 2016-02-17 DIAGNOSIS — F411 Generalized anxiety disorder: Secondary | ICD-10-CM

## 2016-02-17 DIAGNOSIS — R1111 Vomiting without nausea: Secondary | ICD-10-CM

## 2016-02-17 DIAGNOSIS — O99342 Other mental disorders complicating pregnancy, second trimester: Secondary | ICD-10-CM | POA: Insufficient documentation

## 2016-02-17 DIAGNOSIS — O26899 Other specified pregnancy related conditions, unspecified trimester: Secondary | ICD-10-CM

## 2016-02-17 DIAGNOSIS — R109 Unspecified abdominal pain: Secondary | ICD-10-CM | POA: Insufficient documentation

## 2016-02-17 DIAGNOSIS — O9989 Other specified diseases and conditions complicating pregnancy, childbirth and the puerperium: Secondary | ICD-10-CM | POA: Diagnosis not present

## 2016-02-17 DIAGNOSIS — G8929 Other chronic pain: Secondary | ICD-10-CM | POA: Insufficient documentation

## 2016-02-17 DIAGNOSIS — M545 Low back pain: Secondary | ICD-10-CM | POA: Insufficient documentation

## 2016-02-17 DIAGNOSIS — E860 Dehydration: Secondary | ICD-10-CM | POA: Diagnosis not present

## 2016-02-17 DIAGNOSIS — F419 Anxiety disorder, unspecified: Secondary | ICD-10-CM | POA: Diagnosis not present

## 2016-02-17 LAB — CBC WITH DIFFERENTIAL/PLATELET
BASOS ABS: 0 10*3/uL (ref 0.0–0.1)
Basophils Relative: 0 %
Eosinophils Absolute: 0 10*3/uL (ref 0.0–0.7)
Eosinophils Relative: 0 %
HCT: 36.2 % (ref 36.0–46.0)
HEMOGLOBIN: 12 g/dL (ref 12.0–15.0)
LYMPHS ABS: 0.5 10*3/uL — AB (ref 0.7–4.0)
LYMPHS PCT: 7 %
MCH: 29.1 pg (ref 26.0–34.0)
MCHC: 33.1 g/dL (ref 30.0–36.0)
MCV: 87.9 fL (ref 78.0–100.0)
Monocytes Absolute: 0.5 10*3/uL (ref 0.1–1.0)
Monocytes Relative: 7 %
NEUTROS PCT: 86 %
Neutro Abs: 6.5 10*3/uL (ref 1.7–7.7)
Platelets: 244 10*3/uL (ref 150–400)
RBC: 4.12 MIL/uL (ref 3.87–5.11)
RDW: 14 % (ref 11.5–15.5)
WBC: 7.6 10*3/uL (ref 4.0–10.5)

## 2016-02-17 LAB — URINE MICROSCOPIC-ADD ON

## 2016-02-17 LAB — URINALYSIS, ROUTINE W REFLEX MICROSCOPIC
BILIRUBIN URINE: NEGATIVE
Glucose, UA: NEGATIVE mg/dL
Hgb urine dipstick: NEGATIVE
Ketones, ur: >80 mg/dL — AB
LEUKOCYTES UA: NEGATIVE
NITRITE: NEGATIVE
PROTEIN: 30 mg/dL — AB
Specific Gravity, Urine: 1.02 (ref 1.005–1.030)
pH: 6 (ref 5.0–8.0)

## 2016-02-17 MED ORDER — ONDANSETRON 8 MG PO TBDP
8.0000 mg | ORAL_TABLET | Freq: Once | ORAL | Status: AC
  Administered 2016-02-17: 8 mg via ORAL
  Filled 2016-02-17: qty 1

## 2016-02-17 NOTE — MAU Note (Signed)
Felt short of breath and has had before and was told it was from having twins and anxiety attacks.  Vomited twice today.  Nausea.  Having sharp low abd pain throughout day before vomiting.  Abd pain worsened at 7 pm. No bleeding.  No leaking.  Babies moving this morning but now, not since 3 pm.

## 2016-02-17 NOTE — MAU Provider Note (Signed)
History     CSN: [id number]  Arrival date and time: 02/17/16 2058   First Provider Initiated Contact with Patient 02/17/16 2156      Chief Complaint  Patient presents with  . Nausea  . Abdominal Pain  . Emesis During Pregnancy   HPI Ms. Tiffany Solomon is a 30 y.o. G2P0101 at [redacted]w[redacted]d with twin gestation who presents to MAU today with complaint of abdominal pain. The patient states that she has had pain throughout the day. The patient is just below the umbilicus and radiates down into the pelvis. She also has chronic low back pain. She states all pain is worse with ambulation. She attempted to take Tylenol this afternoon when pain worsened around 1500, but had almost immediate vomiting. She states one other episode of vomiting around 1900. She denies nausea at any point today. She states that around 1900 she would have rated pain at 9/10 which prompted her to come to MAU for evaluation. Upon arrival in MAU she denies pain. She also denies diarrhea, constipation, fever, UTI symptoms or contractions today. She does states a significant history of SOB recently and anxiety. She initially had declined Zoloft as a possible treatment.   OB History    Gravida Para Term Preterm AB TAB SAB Ectopic Multiple Living   2 1  1      0 1      Past Medical History  Diagnosis Date  . Fibroid   . Anemia   . Depression     Past Surgical History  Procedure Laterality Date  . Myomectomy    . Dilation and curettage of uterus    . Cesarean section N/A 08/27/2014    Procedure: CESAREAN SECTION;  Surgeon: Maeola Sarah. Landry Mellow, MD;  Location: [facility];  Service: Obstetrics;  Laterality: N/A;  . Root canal      Family History  Problem Relation Age of Onset  . Brain cancer Mother   . Heart disease Maternal Aunt   . Hypertension Maternal Aunt     Social History  Substance Use Topics  . Smoking status: Never Smoker   . Smokeless tobacco: None  . Alcohol Use: No    Allergies: No Known  Allergies  Prescriptions prior to admission  Medication Sig Dispense Refill Last Dose  . acetaminophen (TYLENOL) 500 MG tablet Take 1,000 mg by mouth every 6 (six) hours as needed for mild pain or headache.   02/17/2016 at Unknown time  . Prenatal Vit-Fe Fumarate-FA (PRENATAL MULTIVITAMIN) TABS tablet Take 1 tablet by mouth at bedtime.    02/16/2016 at Unknown time  . Doxylamine-Pyridoxine (DICLEGIS) 10-10 MG TBEC Take 2 tablets by mouth at bedtime as needed (for nausea). Reported on 02/05/2016   Not Taking  . guaiFENesin (ROBITUSSIN) 100 MG/5ML liquid Take 800 mg by mouth 3 (three) times daily as needed for cough. Reported on 02/05/2016   Not Taking  . mineral oil liquid Take 15 mLs by mouth daily as needed for mild constipation or moderate constipation. Reported on 02/05/2016   Not Taking  . sodium chloride (OCEAN) 0.65 % SOLN nasal spray Place 1 spray into both nostrils as needed for congestion. Reported on 02/05/2016   Not Taking    Review of Systems  Constitutional: Negative for fever and malaise/fatigue.  Gastrointestinal: Positive for nausea, vomiting and abdominal pain. Negative for diarrhea and constipation.  Genitourinary: Negative for dysuria, urgency and frequency.       Neg - vaginal bleeding, LOF   Physical Exam  Blood pressure 112/64, pulse 138, temperature 99.4 F (37.4 C), temperature source Oral, resp. rate 22, last menstrual period 08/19/2015, SpO2 99 %, unknown if currently breastfeeding.  Physical Exam  Nursing note and vitals reviewed. Constitutional: She is oriented to person, place, and time. She appears well-developed and well-nourished. No distress.  HENT:  Head: Normocephalic and atraumatic.  Cardiovascular: Regular rhythm and normal heart sounds.  Tachycardia present.   Respiratory: Effort normal and breath sounds normal. No respiratory distress. She has no wheezes. She has no rales. She exhibits no tenderness.  GI: Soft. She exhibits no distension and no mass. There  is no tenderness. There is no rebound and no guarding.  Neurological: She is alert and oriented to person, place, and time.  Skin: Skin is warm and dry. No erythema.  Psychiatric: She has a normal mood and affect.  Dilation: Closed Effacement (%): Thick Cervical Position: Posterior Exam by:: Chelesea Weiand, PA-C  Results for orders placed or performed during the hospital encounter of 02/17/16 (from the past 24 hour(s))  Urinalysis, Routine w reflex microscopic (not at [facility])     Status: Abnormal   Collection Time: 02/17/16 10:05 PM  Result Value Ref Range   Color, Urine YELLOW YELLOW   APPearance CLEAR CLEAR   Specific Gravity, Urine 1.020 1.005 - 1.030   pH 6.0 5.0 - 8.0   Glucose, UA NEGATIVE NEGATIVE mg/dL   Hgb urine dipstick NEGATIVE NEGATIVE   Bilirubin Urine NEGATIVE NEGATIVE   Ketones, ur >80 (A) NEGATIVE mg/dL   Protein, ur 30 (A) NEGATIVE mg/dL   Nitrite NEGATIVE NEGATIVE   Leukocytes, UA NEGATIVE NEGATIVE  Urine microscopic-add on     Status: Abnormal   Collection Time: 02/17/16 10:05 PM  Result Value Ref Range   Squamous Epithelial / LPF 0-5 (A) NONE SEEN   WBC, UA 0-5 0 - 5 WBC/hpf   RBC / HPF 0-5 0 - 5 RBC/hpf   Bacteria, UA FEW (A) NONE SEEN  CBC with Differential/Platelet     Status: Abnormal   Collection Time: 02/17/16 10:46 PM  Result Value Ref Range   WBC 7.6 4.0 - 10.5 K/uL   RBC 4.12 3.87 - 5.11 MIL/uL   Hemoglobin 12.0 12.0 - 15.0 g/dL   HCT 36.2 36.0 - 46.0 %   MCV 87.9 78.0 - 100.0 fL   MCH 29.1 26.0 - 34.0 pg   MCHC 33.1 30.0 - 36.0 g/dL   RDW 14.0 11.5 - 15.5 %   Platelets 244 150 - 400 K/uL   Neutrophils Relative % 86 %   Neutro Abs 6.5 1.7 - 7.7 K/uL   Lymphocytes Relative 7 %   Lymphs Abs 0.5 (L) 0.7 - 4.0 K/uL   Monocytes Relative 7 %   Monocytes Absolute 0.5 0.1 - 1.0 K/uL   Eosinophils Relative 0 %   Eosinophils Absolute 0.0 0.0 - 0.7 K/uL   Basophils Relative 0 %   Basophils Absolute 0.0 0.0 - 0.1 K/uL  Comprehensive metabolic panel      Status: Abnormal   Collection Time: 02/17/16 10:46 PM  Result Value Ref Range   Sodium 135 135 - 145 mmol/L   Potassium 3.4 (L) 3.5 - 5.1 mmol/L   Chloride 107 101 - 111 mmol/L   CO2 20 (L) 22 - 32 mmol/L   Glucose, Bld 98 65 - 99 mg/dL   BUN <5 (L) 6 - 20 mg/dL   Creatinine, Ser 0.53 0.44 - 1.00 mg/dL   Calcium 8.9 8.9 - 10.3 mg/dL  Total Protein 7.0 6.5 - 8.1 g/dL   Albumin 3.4 (L) 3.5 - 5.0 g/dL   AST 16 15 - 41 U/L   ALT 14 14 - 54 U/L   Alkaline Phosphatase 115 38 - 126 U/L   Total Bilirubin 0.8 0.3 - 1.2 mg/dL   GFR calc non Af Amer >60 >60 mL/min   GFR calc Af Amer >60 >60 mL/min   Anion gap 8 5 - 15    MAU Course  Procedures None  MDM ODT Zofran given in MAU UA, CBC, CMP and EKG today  EKG - sinus tachycardia Discussed patient with Dr. Landry Mellow. Advised of normal labs and improvement in patient's symptoms with rest. Patient has been able to tolerate PO while in MAU without N/V. FHR obtained with doppler for both fetuses.  Dr. Landry Mellow advises patient trial Zoloft. She states that she may follow-up in the office as scheduled tomorrow for re-check of tachycardia and possible outpatient cardiology referral.  Assessment and Plan  A: Twin IUP at [redacted]w[redacted]d Abdominal pain in pregnancy, second trimester Dehydration  Anxiety   P: Discharge home Rx for Zoloft given to patient  Second trimester precautions discussed Advised increased PO hydration as tolerated Tylenol PRN for pain, also discussed use of abdominal binder and possible aid for abdominal pain and back pain, especially with ambulation Patient advised to follow-up with Dr. Landry Mellow tomorrow as scheduled Patient may return to MAU as needed or if her condition were to change or worsen   [name], PA-C  02/18/2016, 1:30 AM

## 2016-02-18 ENCOUNTER — Encounter: Payer: Self-pay | Admitting: *Deleted

## 2016-02-18 DIAGNOSIS — R109 Unspecified abdominal pain: Secondary | ICD-10-CM

## 2016-02-18 DIAGNOSIS — F411 Generalized anxiety disorder: Secondary | ICD-10-CM

## 2016-02-18 DIAGNOSIS — O9989 Other specified diseases and conditions complicating pregnancy, childbirth and the puerperium: Secondary | ICD-10-CM

## 2016-02-18 LAB — COMPREHENSIVE METABOLIC PANEL
ALT: 14 U/L (ref 14–54)
ANION GAP: 8 (ref 5–15)
AST: 16 U/L (ref 15–41)
Albumin: 3.4 g/dL — ABNORMAL LOW (ref 3.5–5.0)
Alkaline Phosphatase: 115 U/L (ref 38–126)
CO2: 20 mmol/L — ABNORMAL LOW (ref 22–32)
Calcium: 8.9 mg/dL (ref 8.9–10.3)
Chloride: 107 mmol/L (ref 101–111)
Creatinine, Ser: 0.53 mg/dL (ref 0.44–1.00)
GFR calc Af Amer: >60 mL/min (ref >60)
GFR calc non Af Amer: >60 mL/min (ref >60)
Glucose, Bld: 98 mg/dL (ref 65–99)
POTASSIUM: 3.4 mmol/L — AB (ref 3.5–5.1)
SODIUM: 135 mmol/L (ref 135–145)
Total Bilirubin: 0.8 mg/dL (ref 0.3–1.2)
Total Protein: 7 g/dL (ref 6.5–8.1)

## 2016-02-18 MED ORDER — SERTRALINE HCL 25 MG PO TABS: 25.0000 mg | ORAL_TABLET | Freq: Every day | ORAL | Status: DC

## 2016-02-18 NOTE — Discharge Instructions (Signed)

## 2016-02-29 ENCOUNTER — Ambulatory Visit (INDEPENDENT_AMBULATORY_CARE_PROVIDER_SITE_OTHER): Payer: 59 | Admitting: Cardiology

## 2016-02-29 ENCOUNTER — Encounter: Payer: Self-pay | Admitting: Cardiology

## 2016-02-29 DIAGNOSIS — R0602 Shortness of breath: Secondary | ICD-10-CM

## 2016-02-29 DIAGNOSIS — R002 Palpitations: Secondary | ICD-10-CM | POA: Insufficient documentation

## 2016-02-29 DIAGNOSIS — Z3490 Encounter for supervision of normal pregnancy, unspecified, unspecified trimester: Secondary | ICD-10-CM | POA: Insufficient documentation

## 2016-02-29 NOTE — Progress Notes (Signed)
Cardiology Office Note    Date:  02/29/2016   ID:  TYSHEMA KULT, DOB Mar 03, 1986, MRN [id number]  PCP:  No PCP Per Patient  Cardiologist:  Fransico Him, MD   Chief Complaint  Patient presents with  . Shortness of Breath    History of Present Illness:  Tiffany Solomon is a 30 y.o. female with a history of anemia and depression who is [redacted] weeks pregnant with twin boys.  This is her second pregnancy and she has not had any problems with her prior pregnancy.  She started having problems with SOB at around 3 months of pregnancy.  This can occur with exertion or nonexertion.  She was sitting in church yesterday and felt like all of a sudden she had ran a marathon.  She was in the ER a week ago for abdominal pain and SOB.  and her Hgb was normal.    Her HR was increased to 141bpm at that ER visit and was felt to be due to anxiety.  EKG showed sinus tachycardia with no ST changes.  She was placed on Zoloft last week for possible anxiety.  She denies any chest pain but has had some pressure.  She has had some palpitations but no dizziness or syncope.    Past Medical History:  Diagnosis Date  . Anemia   . Depression   . Fibroid   . S/P cesarean section 08/27/2014  . Traumatic injury during pregnancy, antepartum     Past Surgical History:  Procedure Laterality Date  . CESAREAN SECTION N/A 08/27/2014   Procedure: CESAREAN SECTION;  Surgeon: Maeola Sarah. Landry Mellow, MD;  Location: [facility];  Service: Obstetrics;  Laterality: N/A;  . DILATION AND CURETTAGE OF UTERUS    . MYOMECTOMY    . ROOT CANAL      Current Medications: Outpatient Medications Prior to Visit  Medication Sig Dispense Refill  . acetaminophen (TYLENOL) 500 MG tablet Take 1,000 mg by mouth every 6 (six) hours as needed for mild pain or headache.    . Prenatal Vit-Fe Fumarate-FA (PRENATAL MULTIVITAMIN) TABS tablet Take 1 tablet by mouth at bedtime.     . sertraline (ZOLOFT) 25 MG tablet Take 1 tablet (25 mg total) by mouth daily. 30  tablet 0   No facility-administered medications prior to visit.      Allergies:   Review of patient's allergies indicates no known allergies.   Social History   Social History  . Marital status: Single    Spouse name: N/A  . Number of children: N/A  . Years of education: N/A   Social History Main Topics  . Smoking status: Never Smoker  . Smokeless tobacco: Never Used  . Alcohol use No  . Drug use: No  . Sexual activity: Not Asked   Other Topics Concern  . None   Social History Narrative  . None     Family History:  The patient's family history includes Brain cancer in her mother; Heart disease in her maternal aunt; Hypertension in her maternal aunt.   ROS:   Please see the history of present illness.    ROS All other systems reviewed and are negative.   PHYSICAL EXAM:   VS:  BP 112/64   Pulse (!) 118   Ht 5\' 4"  (1.626 m)   Wt 228 lb 6.4 oz (103.6 kg)   LMP 08/19/2015   SpO2 98%   BMI 39.20 kg/m    GEN: Well nourished, well developed, in no acute  distress  HEENT: normal  Neck: no JVD, carotid bruits, or masses Cardiac: RRR; no murmurs, rubs, or gallops,no edema.  Intact distal pulses bilaterally.  Respiratory:  clear to auscultation bilaterally, normal work of breathing GI: soft, nontender, nondistended, + BS MS: no deformity or atrophy  Skin: warm and dry, no rash Neuro:  Alert and Oriented x 3, Strength and sensation are intact Psych: euthymic mood, full affect  Wt Readings from Last 3 Encounters:  02/29/16 228 lb 6.4 oz (103.6 kg)  02/05/16 232 lb 12.8 oz (105.6 kg)  08/28/14 238 lb (108 kg)      Studies/Labs Reviewed:   EKG:  EKG is not ordered today.    Recent Labs: 02/17/2016: ALT 14; BUN <5; Creatinine, Ser 0.53; Hemoglobin 12.0; Platelets 244; Potassium 3.4; Sodium 135   Lipid Panel No results found for: CHOL, TRIG, HDL, CHOLHDL, VLDL, LDLCALC, LDLDIRECT  Additional studies/ records that were reviewed today include:  OV notes from  OB    ASSESSMENT:    No diagnosis found.   PLAN:  In order of problems listed above:  1. SOB - I suspect that this is due to her pregnancy with enlarging uterus especially in light of having twins.  I will check a TSH, BNP and 2D echo to assess LVF.   2. Sinus tachycardia most likely secondary to pregnancy.  I will check a TSH and get a 24 hour Holter to assess average heart rate.     Medication Adjustments/Labs and Tests Ordered: Current medicines are reviewed at length with the patient today.  Concerns regarding medicines are outlined above.  Medication changes, Labs and Tests ordered today are listed in the Patient Instructions below.  There are no Patient Instructions on file for this visit.   Signed, Fransico Him, MD  02/29/2016 1:54 PM    [facility] [address], [place], [place]  [address] Phone: [phone]; Fax: [phone]

## 2016-02-29 NOTE — Patient Instructions (Signed)
Medication Instructions:   NO CHANGE  Labwork:  Your physician recommends that you HAVE LAB WORK TODAY  Testing/Procedures:  Your physician has requested that you have an echocardiogram. Echocardiography is a painless test that uses sound waves to create images of your heart. It provides your doctor with information about the size and shape of your heart and how well your heart's chambers and valves are working. This procedure takes approximately one hour. There are no restrictions for this procedure.  Your physician has recommended that you wear a 24 HOUR holter monitor. Holter monitors are medical devices that record the heart's electrical activity. Doctors most often use these monitors to diagnose arrhythmias. Arrhythmias are problems with the speed or rhythm of the heartbeat. The monitor is a small, portable device. You can wear one while you do your normal daily activities. This is usually used to diagnose what is causing palpitations/syncope (passing out).      Follow-Up:  Your physician recommends that you schedule a follow-up appointment in: 4 WEEKS WITH DR TURNER   Any Other Special Instructions Will Be Listed Below (If Applicable).

## 2016-03-01 LAB — BRAIN NATRIURETIC PEPTIDE: Brain Natriuretic Peptide: 14.1 pg/mL (ref <100)

## 2016-03-01 LAB — TSH: TSH: 0.86 m[IU]/L

## 2016-03-09 ENCOUNTER — Ambulatory Visit (HOSPITAL_COMMUNITY): Payer: Medicaid Other | Attending: Cardiology

## 2016-03-09 ENCOUNTER — Ambulatory Visit (INDEPENDENT_AMBULATORY_CARE_PROVIDER_SITE_OTHER): Payer: Medicaid Other

## 2016-03-09 ENCOUNTER — Other Ambulatory Visit: Payer: Self-pay

## 2016-03-09 DIAGNOSIS — Z8249 Family history of ischemic heart disease and other diseases of the circulatory system: Secondary | ICD-10-CM | POA: Insufficient documentation

## 2016-03-09 DIAGNOSIS — R002 Palpitations: Secondary | ICD-10-CM | POA: Diagnosis not present

## 2016-03-09 DIAGNOSIS — Z331 Pregnant state, incidental: Secondary | ICD-10-CM | POA: Insufficient documentation

## 2016-03-09 DIAGNOSIS — R0602 Shortness of breath: Secondary | ICD-10-CM | POA: Diagnosis not present

## 2016-03-09 DIAGNOSIS — I358 Other nonrheumatic aortic valve disorders: Secondary | ICD-10-CM | POA: Insufficient documentation

## 2016-03-09 DIAGNOSIS — I517 Cardiomegaly: Secondary | ICD-10-CM | POA: Insufficient documentation

## 2016-03-09 DIAGNOSIS — R0600 Dyspnea, unspecified: Secondary | ICD-10-CM | POA: Diagnosis present

## 2016-04-12 ENCOUNTER — Ambulatory Visit: Payer: 59 | Admitting: Cardiology

## 2016-04-18 ENCOUNTER — Encounter: Payer: Self-pay | Admitting: Physician Assistant

## 2016-04-18 NOTE — Progress Notes (Signed)
Cardiology Office Note    Date:  04/19/2016  ID:  Tiffany Solomon, DOB October 12, 1985, MRN [id number] PCP:  No PCP Per Patient  Cardiologist:  Dr. Radford Pax  Chief Complaint: f/u Holter  History of Present Illness:  Tiffany Solomon is a 30 y.o. female with history of anemia, depression, fibroids who presents to f/u heart monitor results. She recently saw Dr. Radford Pax 02/29/16 for elevated HR. She began having problems with SOB around 3 months gestation, occurring at random. She had had an episode where she was sitting in church and suddenly felt like she had run a marathon. She was seen in the ED 02/17/16 for SOB/abd pain. HR was increased to 141 felt due to anxiety, EKG with sinus tach. Hgb 12. She was placed on Zoloft. At her visit with Dr. Radford Pax, HR was 118. TSH and BNP were normal. 2D echo 03/09/16: EF [id number], normal diastolic function. 24 Hour Holter showed NSR and sinus tach, average HR 109bpm, occasional PVC - Dr. Radford Pax reviewed this and felt findings were normal for this time in her pregnancy and felt her SOB was likely due to enlarging uterus especially in setting of twins.  She returns to clinic today at [redacted] weeks gestation. She just left OB office and babies are growing very well - each over 4lb at this point. She has a scheduled C-section planned for 05/23/16. She continues to work. She notes the typical complaints common in pregnancy - sciatica of right leg, occasional leg cramps, SOB when lying down. She continues to get mild DOE, unchanged from last visit. No chest pain, pleuritic discomfort, LEE or erythema.   Past Medical History:  Diagnosis Date  . Anemia   . Depression   . Fibroid   . PVC's (premature ventricular contractions)    a. occ PVC seen on holter monitor 03/2016.  . S/P cesarean section 08/27/2014  . Sinus tachycardia (Tiffany Solomon)   . Traumatic injury during pregnancy, antepartum     Past Surgical History:  Procedure Laterality Date  . CESAREAN SECTION N/A 08/27/2014   Procedure: CESAREAN SECTION;  Surgeon: Tiffany Sarah. Landry Mellow, MD;  Location: [facility];  Service: Obstetrics;  Laterality: N/A;  . DILATION AND CURETTAGE OF UTERUS    . MYOMECTOMY    . ROOT CANAL      Current Medications: Current Outpatient Prescriptions  Medication Sig Dispense Refill  . acetaminophen (TYLENOL) 500 MG tablet Take 1,000 mg by mouth every 6 (six) hours as needed for mild pain or headache.    . Prenatal Vit-Fe Fumarate-FA (PRENATAL MULTIVITAMIN) TABS tablet Take 1 tablet by mouth at bedtime.     . sertraline (ZOLOFT) 25 MG tablet Take 1 tablet (25 mg total) by mouth daily. 30 tablet 0   No current facility-administered medications for this visit.      Allergies:   Review of patient's allergies indicates no known allergies.   Social History   Social History  . Marital status: Single    Spouse name: N/A  . Number of children: N/A  . Years of education: N/A   Social History Main Topics  . Smoking status: Never Smoker  . Smokeless tobacco: Never Used  . Alcohol use No  . Drug use: No  . Sexual activity: Not Asked   Other Topics Concern  . None   Social History Narrative  . None     Family History:  The patient's family history includes Brain cancer in her mother; Heart disease in her maternal aunt; Hypertension in  her maternal aunt.   ROS:   Please see the history of present illness. No syncope. All other systems are reviewed and otherwise negative.    PHYSICAL EXAM:   VS:  BP (!) 110/58   Pulse (!) 119   Ht 5\' 4"  (1.626 m)   Wt 233 lb (105.7 kg)   LMP 08/19/2015   BMI 39.99 kg/m   BMI: Body mass index is 39.99 kg/m. GEN: Well nourished, well developed AAF in no acute distress  HEENT: normocephalic, atraumatic Neck: no JVD, carotid bruits, or masses Cardiac: RRR; no murmurs, rubs, or gallops, no edema  Respiratory:  clear to auscultation bilaterally, normal work of breathing Abd: soft, nontender, distended abdomen due to twin pregnancy MS: no deformity or  atrophy  Skin: warm and dry, no rash Neuro:  Alert and Oriented x 3, Strength and sensation are intact, follows commands Psych: euthymic mood, full affect  Wt Readings from Last 3 Encounters:  04/19/16 233 lb (105.7 kg)  02/29/16 228 lb 6.4 oz (103.6 kg)  02/05/16 232 lb 12.8 oz (105.6 kg)      Studies/Labs Reviewed:   EKG:  EKG was ordered today and personally reviewed by me and demonstrates sinus tach 119bpm no acute changes  Recent Labs: 02/17/2016: ALT 14; BUN <5; Creatinine, Ser 0.53; Hemoglobin 12.0; Platelets 244; Potassium 3.4; Sodium 135 02/29/2016: Brain Natriuretic Peptide 14.1; TSH 0.86   Additional studies/ records that were reviewed today include: Summarized above.    ASSESSMENT & PLAN:   1. Sinus tachycardia - reviewed EKG with Dr. Radford Pax who feels this is physiologically driven by her twin pregnancy. HR remains similar to prior value at this time. Ancillary workup unremarkable. Will plan to have her follow up about 8 weeks post-partum to reassess HR control. She does not recall what her baseline HR has been in the past when non-pregnant. 2. PVCs - denies specific symptoms related to this. 3. Shortness of breath - felt likely due to enlarging uterus. Echo was normal and BNP was normal. No signs of DVT on exam. No pleuritic chest pain or hypoxia. Patient reminded not to lie flat on her back as this can compress the aorta and make SOB worse.  Disposition: Have asked her to contact our office after she delivers to see [place] back about 8 weeks post-partum to reassess HR at that time.   Medication Adjustments/Labs and Tests Ordered: Current medicines are reviewed at length with the patient today.  Concerns regarding medicines are outlined above. Medication changes, Labs and Tests ordered today are summarized above and listed in the Patient Instructions accessible in Encounters.   Raechel Ache PA-C  04/19/2016 10:02 AM    [facility] [place], [place], [place]  [address] Phone: [phone]; Fax: [phone]

## 2016-04-19 ENCOUNTER — Ambulatory Visit (INDEPENDENT_AMBULATORY_CARE_PROVIDER_SITE_OTHER): Payer: Medicaid Other | Admitting: Physician Assistant

## 2016-04-19 ENCOUNTER — Encounter (INDEPENDENT_AMBULATORY_CARE_PROVIDER_SITE_OTHER): Payer: Self-pay

## 2016-04-19 ENCOUNTER — Encounter: Payer: Self-pay | Admitting: Physician Assistant

## 2016-04-19 VITALS — BP 110/58 | HR 119 | Ht 64.0 in | Wt 233.0 lb

## 2016-04-19 DIAGNOSIS — I493 Ventricular premature depolarization: Secondary | ICD-10-CM | POA: Diagnosis not present

## 2016-04-19 DIAGNOSIS — R000 Tachycardia, unspecified: Secondary | ICD-10-CM

## 2016-04-19 DIAGNOSIS — R0602 Shortness of breath: Secondary | ICD-10-CM

## 2016-04-19 NOTE — Patient Instructions (Signed)
Medication Instructions:  Your physician recommends that you continue on your current medications as directed. Please refer to the Current Medication list given to you today.   Labwork: none  Testing/Procedures: none  Follow-Up: Your physician recommends that you schedule a follow-up appointment: 8 weeks after you deliver - with Dr. Radford Pax or Melina Copa, PA.   Any Other Special Instructions Will Be Listed Below (If Applicable).     If you need a refill on your cardiac medications before your next appointment, please call your pharmacy.

## 2016-04-28 ENCOUNTER — Encounter (HOSPITAL_COMMUNITY): Payer: Self-pay | Admitting: *Deleted

## 2016-04-28 ENCOUNTER — Inpatient Hospital Stay (HOSPITAL_COMMUNITY)
Admission: AD | Admit: 2016-04-28 | Discharge: 2016-04-28 | Disposition: A | Discharge status: Home / Self Care | Payer: Medicaid Other | Source: Ambulatory Visit | Attending: Obstetrics and Gynecology | Admitting: Obstetrics and Gynecology

## 2016-04-28 DIAGNOSIS — O30003 Twin pregnancy, unspecified number of placenta and unspecified number of amniotic sacs, third trimester: Secondary | ICD-10-CM | POA: Insufficient documentation

## 2016-04-28 DIAGNOSIS — O34219 Maternal care for unspecified type scar from previous cesarean delivery: Secondary | ICD-10-CM | POA: Diagnosis not present

## 2016-04-28 DIAGNOSIS — Z3A32 32 weeks gestation of pregnancy: Secondary | ICD-10-CM

## 2016-04-28 DIAGNOSIS — Z3689 Encounter for other specified antenatal screening: Secondary | ICD-10-CM

## 2016-04-28 DIAGNOSIS — IMO0002 Reserved for concepts with insufficient information to code with codable children: Secondary | ICD-10-CM

## 2016-04-28 MED ORDER — LACTATED RINGERS IV BOLUS (SEPSIS)
1000.0000 mL | Freq: Once | INTRAVENOUS | Status: AC
  Administered 2016-04-28: 1000 mL via INTRAVENOUS

## 2016-04-28 NOTE — MAU Provider Note (Signed)
History     CSN: [id number]  Arrival date and time: 04/28/16 0905   First Provider Initiated Contact with Patient 04/28/16 [phone]      Chief Complaint  Patient presents with  . elevated fetal rate   [id number] @32 .5 weeks with twins sent from office for fetal tachycardia. She denies fever. She reports good hydration but has had poor appetite throughout the pregnancy. She reports good FM. No ctx, LOF, or VB. Pregnancy complicated by previous CS-planning rpt at 36 weeks, and maternal tachycardia which is followed by Cardiology.   OB History    Gravida Para Term Preterm AB Living   3 1   1 1 1    SAB TAB Ectopic Multiple Live Births   1     0 1      Past Medical History:  Diagnosis Date  . Anemia   . Depression   . Fibroid   . PVC's (premature ventricular contractions)    a. occ PVC seen on holter monitor 03/2016.  . S/P cesarean section 08/27/2014  . Sinus tachycardia ([place])   . Traumatic injury during pregnancy, antepartum     Past Surgical History:  Procedure Laterality Date  . CESAREAN SECTION N/A 08/27/2014   Procedure: CESAREAN SECTION;  Surgeon: Maeola Sarah. Landry Mellow, MD;  Location: [facility];  Service: Obstetrics;  Laterality: N/A;  . DILATION AND CURETTAGE OF UTERUS    . MYOMECTOMY    . ROOT CANAL      Family History  Problem Relation Age of Onset  . Brain cancer Mother   . Heart disease Maternal Aunt   . Hypertension Maternal Aunt     Social History  Substance Use Topics  . Smoking status: Never Smoker  . Smokeless tobacco: Never Used  . Alcohol use No    Allergies: No Known Allergies  Prescriptions Prior to Admission  Medication Sig Dispense Refill Last Dose  . acetaminophen (TYLENOL) 500 MG tablet Take 1,000 mg by mouth every 6 (six) hours as needed for mild pain or headache.   Taking  . Prenatal Vit-Fe Fumarate-FA (PRENATAL MULTIVITAMIN) TABS tablet Take 1 tablet by mouth at bedtime.    Taking  . sertraline (ZOLOFT) 25 MG tablet Take 1 tablet (25 mg total) by  mouth daily. 30 tablet 0 Taking    Review of Systems  Constitutional: Negative.   Gastrointestinal: Negative.    Physical Exam   Blood pressure 112/65, pulse 119, temperature 98.2 F (36.8 C), temperature source Oral, resp. rate 18, last menstrual period 08/19/2015, SpO2 99 %, unknown if currently breastfeeding.  Physical Exam  Constitutional: She is oriented to person, place, and time. She appears well-developed and well-nourished.  HENT:  Head: Normocephalic.  Neck: Normal range of motion.  Cardiovascular:  tachy  Respiratory: Effort normal.  GI: Soft. She exhibits no distension. There is no tenderness.  gravid  Musculoskeletal: Normal range of motion.  Neurological: She is alert and oriented to person, place, and time.  Skin: Skin is warm and dry.  Psychiatric: She has a normal mood and affect.  EFM-A: 150 bpm, mod variability, + accels, no decels EFM-B: 155 bpm, mod variability, + accels, no decels Toco: none  MAU Course  Procedures LR 1 L bolus x1  MDM Discussed presentation and clinical findings with Dr. Nelda Marseille. FHR baby B initially 170s and now 150s, FHR baby A 150s, and LR bolus infusing. Plan for discharge after IVF if nml FHR x2. Stable for discharge home.  Assessment and Plan   1.  Twin pregnancy in third trimester   2. NST (non-stress test) reactive   3. Fetal tachycardia    Discharge home Rest and hydrate OOW today and tomorrow Follow up in office as scheduled next week [facility], CNM 04/28/2016, 10:01 AM

## 2016-04-28 NOTE — Discharge Instructions (Signed)
Fetal Movement Counts °Patient Name: __________________________________________________ Patient Due Date: ____________________ °Performing a fetal movement count is highly recommended in high-risk pregnancies, but it is good for every pregnant woman to do. Your health care provider may ask you to start counting fetal movements at 28 weeks of the pregnancy. Fetal movements often increase: °· After eating a full meal. °· After physical activity. °· After eating or drinking something sweet or cold. °· At rest. °Pay attention to when you feel the baby is most active. This will help you notice a pattern of your baby's sleep and wake cycles and what factors contribute to an increase in fetal movement. It is important to perform a fetal movement count at the same time each day when your baby is normally most active.  °HOW TO COUNT FETAL MOVEMENTS °1. Find a quiet and comfortable area to sit or lie down on your left side. Lying on your left side provides the best blood and oxygen circulation to your baby. °2. Write down the day and time on a sheet of paper or in a journal. °3. Start counting kicks, flutters, swishes, rolls, or jabs in a 2-hour period. You should feel at least 10 movements within 2 hours. °4. If you do not feel 10 movements in 2 hours, wait 2-3 hours and count again. Look for a change in the pattern or not enough counts in 2 hours. °SEEK MEDICAL CARE IF: °· You feel less than 10 counts in 2 hours, tried twice. °· There is no movement in over an hour. °· The pattern is changing or taking longer each day to reach 10 counts in 2 hours. °· You feel the baby is not moving as he or she usually does. °Date: ____________ Movements: ____________ Start time: ____________ Finish time: ____________  °Date: ____________ Movements: ____________ Start time: ____________ Finish time: ____________ °Date: ____________ Movements: ____________ Start time: ____________ Finish time: ____________ °Date: ____________ Movements:  ____________ Start time: ____________ Finish time: ____________ °Date: ____________ Movements: ____________ Start time: ____________ Finish time: ____________ °Date: ____________ Movements: ____________ Start time: ____________ Finish time: ____________ °Date: ____________ Movements: ____________ Start time: ____________ Finish time: ____________ °Date: ____________ Movements: ____________ Start time: ____________ Finish time: ____________  °Date: ____________ Movements: ____________ Start time: ____________ Finish time: ____________ °Date: ____________ Movements: ____________ Start time: ____________ Finish time: ____________ °Date: ____________ Movements: ____________ Start time: ____________ Finish time: ____________ °Date: ____________ Movements: ____________ Start time: ____________ Finish time: ____________ °Date: ____________ Movements: ____________ Start time: ____________ Finish time: ____________ °Date: ____________ Movements: ____________ Start time: ____________ Finish time: ____________ °Date: ____________ Movements: ____________ Start time: ____________ Finish time: ____________  °Date: ____________ Movements: ____________ Start time: ____________ Finish time: ____________ °Date: ____________ Movements: ____________ Start time: ____________ Finish time: ____________ °Date: ____________ Movements: ____________ Start time: ____________ Finish time: ____________ °Date: ____________ Movements: ____________ Start time: ____________ Finish time: ____________ °Date: ____________ Movements: ____________ Start time: ____________ Finish time: ____________ °Date: ____________ Movements: ____________ Start time: ____________ Finish time: ____________ °Date: ____________ Movements: ____________ Start time: ____________ Finish time: ____________  °Date: ____________ Movements: ____________ Start time: ____________ Finish time: ____________ °Date: ____________ Movements: ____________ Start time: ____________ Finish  time: ____________ °Date: ____________ Movements: ____________ Start time: ____________ Finish time: ____________ °Date: ____________ Movements: ____________ Start time: ____________ Finish time: ____________ °Date: ____________ Movements: ____________ Start time: ____________ Finish time: ____________ °Date: ____________ Movements: ____________ Start time: ____________ Finish time: ____________ °Date: ____________ Movements: ____________ Start time: ____________ Finish time: ____________  °Date: ____________ Movements: ____________ Start time: ____________ Finish   time: ____________ Date: ____________ Movements: ____________ Start time: ____________ Tiffany Solomon time: ____________ Date: ____________ Movements: ____________ Start time: ____________ Tiffany Solomon time: ____________ Date: ____________ Movements: ____________ Start time: ____________ Tiffany Solomon time: ____________ Date: ____________ Movements: ____________ Start time: ____________ Tiffany Solomon time: ____________ Date: ____________ Movements: ____________ Start time: ____________ Tiffany Solomon time: ____________ Date: ____________ Movements: ____________ Start time: ____________ Tiffany Solomon time: ____________  Date: ____________ Movements: ____________ Start time: ____________ Tiffany Solomon time: ____________ Date: ____________ Movements: ____________ Start time: ____________ Tiffany Solomon time: ____________ Date: ____________ Movements: ____________ Start time: ____________ Tiffany Solomon time: ____________ Date: ____________ Movements: ____________ Start time: ____________ Tiffany Solomon time: ____________ Date: ____________ Movements: ____________ Start time: ____________ Tiffany Solomon time: ____________ Date: ____________ Movements: ____________ Start time: ____________ Tiffany Solomon time: ____________ Date: ____________ Movements: ____________ Start time: ____________ Tiffany Solomon time: ____________  Date: ____________ Movements: ____________ Start time: ____________ Tiffany Solomon time: ____________ Date: ____________  Movements: ____________ Start time: ____________ Tiffany Solomon time: ____________ Date: ____________ Movements: ____________ Start time: ____________ Tiffany Solomon time: ____________ Date: ____________ Movements: ____________ Start time: ____________ Tiffany Solomon time: ____________ Date: ____________ Movements: ____________ Start time: ____________ Tiffany Solomon time: ____________ Date: ____________ Movements: ____________ Start time: ____________ Tiffany Solomon time: ____________ Date: ____________ Movements: ____________ Start time: ____________ Tiffany Solomon time: ____________  Date: ____________ Movements: ____________ Start time: ____________ Tiffany Solomon time: ____________ Date: ____________ Movements: ____________ Start time: ____________ Tiffany Solomon time: ____________ Date: ____________ Movements: ____________ Start time: ____________ Tiffany Solomon time: ____________ Date: ____________ Movements: ____________ Start time: ____________ Tiffany Solomon time: ____________ Date: ____________ Movements: ____________ Start time: ____________ Tiffany Solomon time: ____________ Date: ____________ Movements: ____________ Start time: ____________ Tiffany Solomon time: ____________   This information is not intended to replace advice given to you by your health care provider. Make sure you discuss any questions you have with your health care provider.   Document Released: 08/17/2006 Document Revised: 08/08/2014 Document Reviewed: 05/14/2012 Elsevier Interactive Patient Education 2016 [place] of Pregnancy The third trimester is from week 29 through week 42, months 7 through 9. The third trimester is a time when the fetus is growing rapidly. At the end of the ninth month, the fetus is about 20 inches in length and weighs 6-10 pounds.  BODY CHANGES Your body goes through many changes during pregnancy. The changes vary from woman to woman.   Your weight will continue to increase. You can expect to gain 25-35 pounds (11-16 kg) by the end of the pregnancy.  You may  begin to get stretch marks on your hips, abdomen, and breasts.  You may urinate more often because the fetus is moving lower into your pelvis and pressing on your bladder.  You may develop or continue to have heartburn as a result of your pregnancy.  You may develop constipation because certain hormones are causing the muscles that push waste through your intestines to slow down.  You may develop hemorrhoids or swollen, bulging veins (varicose veins).  You may have pelvic pain because of the weight gain and pregnancy hormones relaxing your joints between the bones in your pelvis. Backaches may result from overexertion of the muscles supporting your posture.  You may have changes in your hair. These can include thickening of your hair, rapid growth, and changes in texture. Some women also have hair loss during or after pregnancy, or hair that feels dry or thin. Your hair will most likely return to normal after your baby is born.  Your breasts will continue to grow and be tender. A yellow discharge may leak from your breasts called colostrum.  Your belly button may stick out.  You may feel short of breath because of your expanding uterus.  You may notice the fetus "dropping," or moving lower in your abdomen.  You may have a bloody mucus discharge. This usually occurs a few days to a week before labor begins.  Your cervix becomes thin and soft (effaced) near your due date. WHAT TO EXPECT AT YOUR PRENATAL EXAMS  You will have prenatal exams every 2 weeks until week 36. Then, you will have weekly prenatal exams. During a routine prenatal visit:  You will be weighed to make sure you and the fetus are growing normally.  Your blood pressure is taken.  Your abdomen will be measured to track your baby's growth.  The fetal heartbeat will be listened to.  Any test results from the previous visit will be discussed.  You may have a cervical check near your due date to see if you have  effaced. At around 36 weeks, your caregiver will check your cervix. At the same time, your caregiver will also perform a test on the secretions of the vaginal tissue. This test is to determine if a type of bacteria, Group B streptococcus, is present. Your caregiver will explain this further. Your caregiver may ask you:  What your birth plan is.  How you are feeling.  If you are feeling the baby move.  If you have had any abnormal symptoms, such as leaking fluid, bleeding, severe headaches, or abdominal cramping.  If you are using any tobacco products, including cigarettes, chewing tobacco, and electronic cigarettes.  If you have any questions. Other tests or screenings that may be performed during your third trimester include:  Blood tests that check for low iron levels (anemia).  Fetal testing to check the health, activity level, and growth of the fetus. Testing is done if you have certain medical conditions or if there are problems during the pregnancy.  HIV (human immunodeficiency virus) testing. If you are at high risk, you may be screened for HIV during your third trimester of pregnancy. FALSE LABOR You may feel small, irregular contractions that eventually go away. These are called Braxton Hicks contractions, or false labor. Contractions may last for hours, days, or even weeks before true labor sets in. If contractions come at regular intervals, intensify, or become painful, it is best to be seen by your caregiver.  SIGNS OF LABOR   Menstrual-like cramps.  Contractions that are 5 minutes apart or less.  Contractions that start on the top of the uterus and spread down to the lower abdomen and back.  A sense of increased pelvic pressure or back pain.  A watery or bloody mucus discharge that comes from the vagina. If you have any of these signs before the 37th week of pregnancy, call your caregiver right away. You need to go to the hospital to get checked immediately. HOME CARE  INSTRUCTIONS   Avoid all smoking, herbs, alcohol, and unprescribed drugs. These chemicals affect the formation and growth of the baby.  Do not use any tobacco products, including cigarettes, chewing tobacco, and electronic cigarettes. If you need help quitting, ask your health care provider. You may receive counseling support and other resources to help you quit.  Follow your caregiver's instructions regarding medicine use. There are medicines that are either safe or unsafe to take during pregnancy.  Exercise only as directed by your caregiver. Experiencing uterine cramps is a good sign to stop exercising.  Continue to eat regular, healthy meals.  Wear a good support bra for  breast tenderness.  Do not use hot tubs, steam rooms, or saunas.  Wear your seat belt at all times when driving.  Avoid raw meat, uncooked cheese, cat litter boxes, and soil used by cats. These carry germs that can cause birth defects in the baby.  Take your prenatal vitamins.  Take 1500-2000 mg of calcium daily starting at the 20th week of pregnancy until you deliver your baby.  Try taking a stool softener (if your caregiver approves) if you develop constipation. Eat more high-fiber foods, such as fresh vegetables or fruit and whole grains. Drink plenty of fluids to keep your urine clear or pale yellow.  Take warm sitz baths to soothe any pain or discomfort caused by hemorrhoids. Use hemorrhoid cream if your caregiver approves.  If you develop varicose veins, wear support hose. Elevate your feet for 15 minutes, 3-4 times a day. Limit salt in your diet.  Avoid heavy lifting, wear low heal shoes, and practice good posture.  Rest a lot with your legs elevated if you have leg cramps or low back pain.  Visit your dentist if you have not gone during your pregnancy. Use a soft toothbrush to brush your teeth and be gentle when you floss.  A sexual relationship may be continued unless your caregiver directs you  otherwise.  Do not travel far distances unless it is absolutely necessary and only with the approval of your caregiver.  Take prenatal classes to understand, practice, and ask questions about the labor and delivery.  Make a trial run to the hospital.  Pack your hospital bag.  Prepare the baby's nursery.  Continue to go to all your prenatal visits as directed by your caregiver. SEEK MEDICAL CARE IF:  You are unsure if you are in labor or if your water has broken.  You have dizziness.  You have mild pelvic cramps, pelvic pressure, or nagging pain in your abdominal area.  You have persistent nausea, vomiting, or diarrhea.  You have a bad smelling vaginal discharge.  You have pain with urination. SEEK IMMEDIATE MEDICAL CARE IF:   You have a fever.  You are leaking fluid from your vagina.  You have spotting or bleeding from your vagina.  You have severe abdominal cramping or pain.  You have rapid weight loss or gain.  You have shortness of breath with chest pain.  You notice sudden or extreme swelling of your face, hands, ankles, feet, or legs.  You have not felt your baby move in over an hour.  You have severe headaches that do not go away with medicine.  You have vision changes.   This information is not intended to replace advice given to you by your health care provider. Make sure you discuss any questions you have with your health care provider.   Document Released: 07/12/2001 Document Revised: 08/08/2014 Document Reviewed: 09/18/2012 Elsevier Interactive Patient Education [employer].

## 2016-04-28 NOTE — MAU Note (Signed)
Pt was in the office for a routine prenatal visit and it was determined that the FHR was elevated.  Pt was sent over from the office for further evaluation.

## 2016-05-12 ENCOUNTER — Encounter (HOSPITAL_COMMUNITY): Payer: Self-pay

## 2016-05-14 ENCOUNTER — Encounter (HOSPITAL_COMMUNITY): Payer: Self-pay | Admitting: *Deleted

## 2016-05-14 ENCOUNTER — Inpatient Hospital Stay (HOSPITAL_COMMUNITY): Payer: Medicaid Other | Admitting: Anesthesiology

## 2016-05-14 ENCOUNTER — Encounter (HOSPITAL_COMMUNITY)
Admission: AD | Disposition: A | Discharge status: Home / Self Care | Payer: Self-pay | Source: Ambulatory Visit | Attending: Obstetrics and Gynecology

## 2016-05-14 ENCOUNTER — Inpatient Hospital Stay (HOSPITAL_COMMUNITY)
Admission: AD | Admit: 2016-05-14 | Discharge: 2016-05-17 | DRG: 765 | Disposition: A | Discharge status: Home / Self Care | Payer: Medicaid Other | Source: Ambulatory Visit | Attending: Obstetrics and Gynecology | Admitting: Obstetrics and Gynecology

## 2016-05-14 DIAGNOSIS — Z6841 Body Mass Index (BMI) 40.0 and over, adult: Secondary | ICD-10-CM

## 2016-05-14 DIAGNOSIS — O99214 Obesity complicating childbirth: Secondary | ICD-10-CM | POA: Diagnosis present

## 2016-05-14 DIAGNOSIS — O30043 Twin pregnancy, dichorionic/diamniotic, third trimester: Secondary | ICD-10-CM | POA: Diagnosis present

## 2016-05-14 DIAGNOSIS — O4693 Antepartum hemorrhage, unspecified, third trimester: Secondary | ICD-10-CM

## 2016-05-14 DIAGNOSIS — O34211 Maternal care for low transverse scar from previous cesarean delivery: Secondary | ICD-10-CM | POA: Diagnosis present

## 2016-05-14 DIAGNOSIS — O4593 Premature separation of placenta, unspecified, third trimester: Secondary | ICD-10-CM | POA: Diagnosis present

## 2016-05-14 DIAGNOSIS — Z3A35 35 weeks gestation of pregnancy: Secondary | ICD-10-CM | POA: Diagnosis not present

## 2016-05-14 DIAGNOSIS — O9081 Anemia of the puerperium: Secondary | ICD-10-CM | POA: Diagnosis not present

## 2016-05-14 DIAGNOSIS — Z98891 History of uterine scar from previous surgery: Secondary | ICD-10-CM

## 2016-05-14 DIAGNOSIS — D649 Anemia, unspecified: Secondary | ICD-10-CM | POA: Diagnosis not present

## 2016-05-14 DIAGNOSIS — O4590 Premature separation of placenta, unspecified, unspecified trimester: Secondary | ICD-10-CM | POA: Diagnosis present

## 2016-05-14 LAB — CBC
HCT: 25.2 % — ABNORMAL LOW (ref 36.0–46.0)
Hemoglobin: 8.3 g/dL — ABNORMAL LOW (ref 12.0–15.0)
MCH: 26.5 pg (ref 26.0–34.0)
MCHC: 32.9 g/dL (ref 30.0–36.0)
MCV: 80.5 fL (ref 78.0–100.0)
PLATELETS: 224 10*3/uL (ref 150–400)
RBC: 3.13 MIL/uL — AB (ref 3.87–5.11)
RDW: 14.6 % (ref 11.5–15.5)
WBC: 7.4 10*3/uL (ref 4.0–10.5)

## 2016-05-14 SURGERY — Surgical Case
Anesthesia: Spinal

## 2016-05-14 MED ORDER — PHENYLEPHRINE 8 MG IN D5W 100 ML (0.08MG/ML) PREMIX OPTIME
INJECTION | INTRAVENOUS | Status: DC | PRN
  Administered 2016-05-14: 60 ug/min via INTRAVENOUS

## 2016-05-14 MED ORDER — FAMOTIDINE IN NACL 20-0.9 MG/50ML-% IV SOLN
INTRAVENOUS | Status: AC
  Filled 2016-05-14: qty 50

## 2016-05-14 MED ORDER — CEFAZOLIN SODIUM-DEXTROSE 2-3 GM-% IV SOLR
INTRAVENOUS | Status: DC | PRN
  Administered 2016-05-14: 2 g via INTRAVENOUS

## 2016-05-14 MED ORDER — KETOROLAC TROMETHAMINE 30 MG/ML IJ SOLN: 30.0000 mg | Freq: Four times a day (QID) | INTRAMUSCULAR | Status: DC | PRN

## 2016-05-14 MED ORDER — PHENYLEPHRINE HCL 10 MG/ML IJ SOLN
INTRAMUSCULAR | Status: DC | PRN
  Administered 2016-05-14 (×4): 80 ug via INTRAVENOUS

## 2016-05-14 MED ORDER — DEXAMETHASONE SODIUM PHOSPHATE 4 MG/ML IJ SOLN
INTRAMUSCULAR | Status: DC | PRN
  Administered 2016-05-14: 4 mg via INTRAVENOUS

## 2016-05-14 MED ORDER — MORPHINE SULFATE (PF) 0.5 MG/ML IJ SOLN
INTRAMUSCULAR | Status: DC | PRN
  Administered 2016-05-14: .2 mg via INTRATHECAL

## 2016-05-14 MED ORDER — ONDANSETRON HCL 4 MG/2ML IJ SOLN
INTRAMUSCULAR | Status: DC | PRN
  Administered 2016-05-14: 4 mg via INTRAVENOUS

## 2016-05-14 MED ORDER — BUPIVACAINE IN DEXTROSE 0.75-8.25 % IT SOLN
INTRATHECAL | Status: DC | PRN
  Administered 2016-05-14: 1.6 mL via INTRATHECAL

## 2016-05-14 MED ORDER — FAMOTIDINE IN NACL 20-0.9 MG/50ML-% IV SOLN
20.0000 mg | Freq: Once | INTRAVENOUS | Status: AC
  Administered 2016-05-14: 20 mg via INTRAVENOUS

## 2016-05-14 MED ORDER — OXYTOCIN 10 UNIT/ML IJ SOLN
INTRAMUSCULAR | Status: AC
  Filled 2016-05-14: qty 4

## 2016-05-14 MED ORDER — DEXAMETHASONE SODIUM PHOSPHATE 4 MG/ML IJ SOLN
INTRAMUSCULAR | Status: AC
  Filled 2016-05-14: qty 1

## 2016-05-14 MED ORDER — SOD CITRATE-CITRIC ACID 500-334 MG/5ML PO SOLN: 30.0000 mL | Freq: Once | ORAL | Status: DC

## 2016-05-14 MED ORDER — ONDANSETRON HCL 4 MG/2ML IJ SOLN
INTRAMUSCULAR | Status: AC
  Filled 2016-05-14: qty 2

## 2016-05-14 MED ORDER — FENTANYL CITRATE (PF) 100 MCG/2ML IJ SOLN
INTRAMUSCULAR | Status: AC
  Filled 2016-05-14: qty 2

## 2016-05-14 MED ORDER — SODIUM CHLORIDE 0.9 % IV SOLN: Freq: Once | INTRAVENOUS | Status: DC

## 2016-05-14 MED ORDER — FENTANYL CITRATE (PF) 100 MCG/2ML IJ SOLN
INTRAMUSCULAR | Status: DC | PRN
  Administered 2016-05-14: 20 ug via INTRATHECAL

## 2016-05-14 MED ORDER — MORPHINE SULFATE-NACL 0.5-0.9 MG/ML-% IV SOSY
PREFILLED_SYRINGE | INTRAVENOUS | Status: AC
  Filled 2016-05-14: qty 1

## 2016-05-14 MED ORDER — SCOPOLAMINE 1 MG/3DAYS TD PT72
MEDICATED_PATCH | TRANSDERMAL | Status: DC | PRN
  Administered 2016-05-14: 1 via TRANSDERMAL

## 2016-05-14 MED ORDER — SOD CITRATE-CITRIC ACID 500-334 MG/5ML PO SOLN
ORAL | Status: AC
  Filled 2016-05-14: qty 15

## 2016-05-14 MED ORDER — LACTATED RINGERS IV BOLUS (SEPSIS)
1000.0000 mL | Freq: Once | INTRAVENOUS | Status: AC
  Administered 2016-05-14: 1000 mL via INTRAVENOUS

## 2016-05-14 MED ORDER — SCOPOLAMINE 1 MG/3DAYS TD PT72
MEDICATED_PATCH | TRANSDERMAL | Status: AC
  Filled 2016-05-14: qty 1

## 2016-05-14 MED ORDER — LACTATED RINGERS IV SOLN
INTRAVENOUS | Status: DC | PRN
  Administered 2016-05-14 (×2): via INTRAVENOUS

## 2016-05-14 MED ORDER — PHENYLEPHRINE 40 MCG/ML (10ML) SYRINGE FOR IV PUSH (FOR BLOOD PRESSURE SUPPORT)
PREFILLED_SYRINGE | INTRAVENOUS | Status: AC
  Filled 2016-05-14: qty 20

## 2016-05-14 MED ORDER — CEFAZOLIN SODIUM-DEXTROSE 2-4 GM/100ML-% IV SOLN
2.0000 g | Freq: Once | INTRAVENOUS | Status: DC
  Filled 2016-05-14: qty 100

## 2016-05-14 SURGICAL SUPPLY — 39 items
BANDAGE ADHESIVE 1X3 (GAUZE/BANDAGES/DRESSINGS) ×8 IMPLANT
BARRIER ADHS 3X4 INTERCEED (GAUZE/BANDAGES/DRESSINGS) IMPLANT
BENZOIN TINCTURE PRP APPL 2/3 (GAUZE/BANDAGES/DRESSINGS) ×4 IMPLANT
CHLORAPREP W/TINT 26ML (MISCELLANEOUS) ×4 IMPLANT
CLAMP CORD UMBIL (MISCELLANEOUS) IMPLANT
CLOSURE WOUND 1/2 X4 (GAUZE/BANDAGES/DRESSINGS) ×1
CLOTH BEACON ORANGE TIMEOUT ST (SAFETY) ×4 IMPLANT
CONTAINER PREFILL 10% NBF 15ML (MISCELLANEOUS) IMPLANT
DRSG OPSITE POSTOP 4X10 (GAUZE/BANDAGES/DRESSINGS) ×4 IMPLANT
DRSG OPSITE POSTOP 4X12 (GAUZE/BANDAGES/DRESSINGS) ×4 IMPLANT
ELECT REM PT RETURN 9FT ADLT (ELECTROSURGICAL) ×4
ELECTRODE REM PT RTRN 9FT ADLT (ELECTROSURGICAL) ×2 IMPLANT
EXTRACTOR VACUUM KIWI (MISCELLANEOUS) IMPLANT
GLOVE BIOGEL M 6.5 STRL (GLOVE) ×8 IMPLANT
GLOVE BIOGEL PI IND STRL 6.5 (GLOVE) ×2 IMPLANT
GLOVE BIOGEL PI IND STRL 7.0 (GLOVE) ×2 IMPLANT
GLOVE BIOGEL PI INDICATOR 6.5 (GLOVE) ×2
GLOVE BIOGEL PI INDICATOR 7.0 (GLOVE) ×2
GOWN STRL REUS W/TWL LRG LVL3 (GOWN DISPOSABLE) ×12 IMPLANT
KIT ABG SYR 3ML LUER SLIP (SYRINGE) IMPLANT
NEEDLE HYPO 25X5/8 SAFETYGLIDE (NEEDLE) IMPLANT
NS IRRIG 1000ML POUR BTL (IV SOLUTION) ×4 IMPLANT
PACK C SECTION WH (CUSTOM PROCEDURE TRAY) ×4 IMPLANT
PAD ABD DERMACEA PRESS 5X9 (GAUZE/BANDAGES/DRESSINGS) ×4 IMPLANT
PAD OB MATERNITY 4.3X12.25 (PERSONAL CARE ITEMS) ×4 IMPLANT
PENCIL SMOKE EVAC W/HOLSTER (ELECTROSURGICAL) ×4 IMPLANT
RTRCTR C-SECT PINK 25CM LRG (MISCELLANEOUS) IMPLANT
STRIP CLOSURE SKIN 1/2X4 (GAUZE/BANDAGES/DRESSINGS) ×3 IMPLANT
SUT PDS AB 0 CT1 27 (SUTURE) ×8 IMPLANT
SUT PLAIN 0 NONE (SUTURE) IMPLANT
SUT VIC AB 0 CTX 36 (SUTURE) ×6
SUT VIC AB 0 CTX36XBRD ANBCTRL (SUTURE) ×6 IMPLANT
SUT VIC AB 2-0 CT1 27 (SUTURE) ×2
SUT VIC AB 2-0 CT1 TAPERPNT 27 (SUTURE) ×2 IMPLANT
SUT VIC AB 3-0 SH 27 (SUTURE)
SUT VIC AB 3-0 SH 27X BRD (SUTURE) IMPLANT
SUT VIC AB 4-0 KS 27 (SUTURE) ×4 IMPLANT
TOWEL OR 17X24 6PK STRL BLUE (TOWEL DISPOSABLE) ×4 IMPLANT
TRAY FOLEY CATH SILVER 14FR (SET/KITS/TRAYS/PACK) ×4 IMPLANT

## 2016-05-14 NOTE — Anesthesia Preprocedure Evaluation (Signed)
Anesthesia Evaluation  Patient identified by MRN, date of birth, ID band Patient awake    Reviewed: Allergy & Precautions, H&P , NPO status , Patient's Chart, lab work & pertinent test results  Airway Mallampati: II  TM Distance: >3 FB Neck ROM: full    Dental no notable dental hx.    Pulmonary neg pulmonary ROS,    Pulmonary exam normal        Cardiovascular negative cardio ROS Normal cardiovascular exam     Neuro/Psych negative neurological ROS     GI/Hepatic negative GI ROS, Neg liver ROS,   Endo/Other  negative endocrine ROSMorbid obesity  Renal/GU negative Renal ROS     Musculoskeletal   Abdominal (+) + obese,   Peds  Hematology   Anesthesia Other Findings   Reproductive/Obstetrics (+) Pregnancy                             Anesthesia Physical Anesthesia Plan  ASA: III and emergent  Anesthesia Plan: Spinal   Post-op Pain Management:    Induction:   Airway Management Planned:   Additional Equipment:   Intra-op Plan:   Post-operative Plan:   Informed Consent: I have reviewed the patients History and Physical, chart, labs and discussed the procedure including the risks, benefits and alternatives for the proposed anesthesia with the patient or authorized representative who has indicated his/her understanding and acceptance.     Plan Discussed with: CRNA and Surgeon  Anesthesia Plan Comments:         Anesthesia Quick Evaluation

## 2016-05-14 NOTE — Op Note (Signed)
Preoperative diagnosis: Intrauterine di-di twin pregnancy at 35 weeks, previous cesarean section, placental abruption  Post operative diagnosis: Same  Anesthesia: Spinal  Anesthesiologist: Dr. Jillyn Hidden  Procedure: Repeat low transverse cesarean section  Surgeon: Dr. Katharine Look Garth Diffley  Assistant: Dr Arlina Robes and Dr Katherine Basset  Estimated blood loss: 800 cc  Procedure:  The patient is emergently taken to OR #9 and given spinal anesthesia without complication. She is placed in the dorsal decubitus position with the pelvis tilted to the left. She is then prepped and draped in a sterile fashion. A Foley catheter is inserted in her bladder.  After assessing adequate level of anesthesia, we perform a Pfannenstiel incision which is brought down sharply to the fascia. The fascia is entered in a low transverse fashion. Linea alba is dissected. Peritoneum is entered in a midline fashion.   The myometrium is then entered in a low transverse fashion, 2 cm above the vesico-uterine junction ; first with knife and then extended bluntly. Amniotic fluid is bloody. We assist the birth of a female  infant in vertex presentation. Mouth and nose are suctioned. The cord is clamped and sectioned. The baby is given to the neonatologist present in the room. We rapidly deliver twin B, female, in vertex presentation. Mouth and nose are suctioned and cord is clamped and sectioned. The baby is given to the neonatologist. Both babies are vigorous. Both placentas deliver spontaneously rapidly after the babies with large clots. They are complete with 3 Vessel cord x2.   10 cc of blood is drawn from the umbilical vein x 2 . Uterine revision is negative.  We proceed with closure of the myometrium in 2 layers: First with a running locked suture of 0 Vicryl, then with a Lembert suture of 0 Vicryl imbricating the first one. Hemostasis is completed with cauterization on peritoneal edges.  Both paracolic gutters are cleaned.  Both tubes and ovaries are assessed and normal. The pelvis is profusely irrigated with warm saline to confirm a satisfactory hemostasis.  Retractors and sponges are removed. Under fascia hemostasis is completed with cauterization. The fascia is then closed with 2 running sutures of 0 Vicryl meeting midline. The wound is irrigated with warm saline and hemostasis is completed with cauterization. The previous scar is removed and then skin is closed with a subcuticular suture of 3-0 Monocryl and Steri-Strips.  Instrument and sponge count is complete x2. Estimated blood loss is 800 cc.  The procedure is well tolerated by the patient who is taken to recovery room in a well and stable condition.  Twin a :female baby named Eulas Post was born at 23:19 and received an Apgar of 7  at 1 minute and 9 at 5 minutes. Twin B: female baby named Cheri Rous was born at 23:19. Apgar of 7 at 1 minute and 7 at 5 minutes.   Specimen: Placenta sent to pathology   [facility] A MD 10/14/201711:59 PM

## 2016-05-14 NOTE — H&P (Signed)
Chief Complaint:  regular contractions   First Provider Initiated Contact with Patient 05/14/16 2155      HPI: Tiffany Solomon is a 30 y.o. [id number] at [redacted]w[redacted]d pt of [facility] who presents to maternity admissions reporting onset of cramping/contractions that were every 5 minutes at 6:45 pm.  The contractions were becoming stronger over time, bringing her to the MAU but she reports they have improved since arrival.   She reports good fetal movement, denies LOF, vaginal bleeding, vaginal itching/burning, urinary symptoms, h/a, dizziness, n/v, or fever/chills.    HPI  Past Medical History:     Past Medical History:  Diagnosis Date  . Anemia   . Blood transfusion without reported diagnosis   . Depression   . Fibroid   . PVC's (premature ventricular contractions)    a. occ PVC seen on holter monitor 03/2016.  . S/P cesarean section 08/27/2014  . Sinus tachycardia   . Traumatic injury during pregnancy, antepartum     Past obstetric history:                 OB History  Gravida Para Term Preterm AB Living  3 1   1 1 1   SAB TAB Ectopic Multiple Live Births  1     0 1    # Outcome Date GA Lbr Len/2nd Weight Sex Delivery Anes PTL Lv  3 Current           2 Preterm 08/27/14 [redacted]w[redacted]d  7 lb 13.2 oz (3.549 kg) F CS-LTranv Spinal  LIV  1 SAB 2012              Past Surgical History:      Past Surgical History:  Procedure Laterality Date  . CESAREAN SECTION N/A 08/27/2014   Procedure: CESAREAN SECTION;  Surgeon: Maeola Sarah. Landry Mellow, MD;  Location: [facility];  Service: Obstetrics;  Laterality: N/A;  . DILATION AND CURETTAGE OF UTERUS    . MYOMECTOMY    . ROOT CANAL      Family History:      Family History  Problem Relation Age of Onset  . Brain cancer Mother   . Heart disease Maternal Aunt   . Hypertension Maternal Aunt     Social History:     Social History  Substance Use Topics  . Smoking status: Never Smoker  . Smokeless tobacco:  Never Used  . Alcohol use No    Allergies: No Known Allergies  Meds:         Prescriptions Prior to Admission  Medication Sig Dispense Refill Last Dose  . acetaminophen (TYLENOL) 500 MG tablet Take 1,000 mg by mouth every 6 (six) hours as needed for mild pain, moderate pain or headache.    unknown at unknown  . hydrOXYzine (ATARAX/VISTARIL) 50 MG tablet Take 50 mg by mouth every 6 (six) hours as needed for itching.   unknown at unknown  . methylPREDNISolone (MEDROL) 4 MG tablet Take 4-24 mg by mouth daily. Pt is to take as a taper:    Six tablets on day 1, five tablets on day 2, four tablets on day 3,...3,2,1   unknown at unknown  . Prenatal Vit-Fe Fumarate-FA (PRENATAL MULTIVITAMIN) TABS tablet Take 1 tablet by mouth at bedtime.    unknown at unknown    ROS:  Review of Systems  Constitutional: Negative for chills, fatigue and fever.  Eyes: Negative for visual disturbance.  Respiratory: Negative for shortness of breath.   Cardiovascular: Negative for chest pain.  Gastrointestinal: Positive for abdominal pain. Negative for nausea and vomiting.  Genitourinary: Positive for pelvic pain. Negative for difficulty urinating, dysuria, flank pain, vaginal bleeding, vaginal discharge and vaginal pain.  Neurological: Negative for dizziness and headaches.  Psychiatric/Behavioral: Negative.      I have reviewed patient's Past Medical Hx, Surgical Hx, Family Hx, Social Hx, medications and allergies.   Physical Exam  No data found.  Constitutional: Well-developed, well-nourished female in no acute distress.  Cardiovascular: normal rate Respiratory: normal effort GI: Abd soft, non-tender, gravid appropriate for gestational age.  MS: Extremities nontender, no edema, normal ROM Neurologic: Alert and oriented x 4.  GU: Neg CVAT.   Dilation: 1.5 Effacement (%): 60 Presentation: Vertex Exam by:: Lisa l., cnm  FHT Baby A:  Baseline 150 , moderate variability,  accelerations present, no decelerations FHT Baby B:  Baseline 150 , moderate variability, accelerations present, no decelerations Contractions: q 3 mins, mild to palpation    MAU Course/MDM: Initially, pt was stable and plan was to give IV fluids and recheck cervix in 1 hour to determine if laboring. IV started and LR 1000 ml running. RN called CNM to notify her that pt water broke and she was seeing bloody fluid.  Fetal monitoring of both Baby A and Baby B were normal at that time.  Called Dr Cletis Media.  Plan to delivery pt by repeat C/S. OR notified and pt readied for C/S.  Bleeding increased with bright red bleeding noted on sheets around pt and then became difficult to assess FHR of both Baby A and Baby B.  Dr Cletis Media notified. Dr Rip Harbour, faculty practice attending notified and pt taken to OR for stat C/S.  Assessment: 1. Vaginal bleeding in pregnancy, third trimester   2. Dichorionic diamniotic twin pregnancy in third trimester     Plan: Admit to OR Stat C/S by Dr Rip Harbour  Arrived at [place] while Dr Jillyn Hidden was completing spinal anesthesia

## 2016-05-14 NOTE — MAU Provider Note (Signed)
Chief Complaint:  No chief complaint on file.   First Provider Initiated Contact with Patient 05/14/16 2155      HPI: Tiffany Solomon is a 30 y.o. [id number] at [redacted]w[redacted]d pt of [facility] who presents to maternity admissions reporting onset of cramping/contractions that were every 5 minutes at 6:45 pm.  The contractions were becoming stronger over time, bringing her to the MAU but she reports they have improved since arrival.   She reports good fetal movement, denies LOF, vaginal bleeding, vaginal itching/burning, urinary symptoms, h/a, dizziness, n/v, or fever/chills.    HPI  Past Medical History: Past Medical History:  Diagnosis Date  . Anemia   . Blood transfusion without reported diagnosis   . Depression   . Fibroid   . PVC's (premature ventricular contractions)    a. occ PVC seen on holter monitor 03/2016.  . S/P cesarean section 08/27/2014  . Sinus tachycardia   . Traumatic injury during pregnancy, antepartum     Past obstetric history: OB History  Gravida Para Term Preterm AB Living  3 1   1 1 1   SAB TAB Ectopic Multiple Live Births  1     0 1    # Outcome Date GA Lbr Len/2nd Weight Sex Delivery Anes PTL Lv  3 Current           2 Preterm 08/27/14 [redacted]w[redacted]d  7 lb 13.2 oz (3.549 kg) F CS-LTranv Spinal  LIV  1 SAB 2012              Past Surgical History: Past Surgical History:  Procedure Laterality Date  . CESAREAN SECTION N/A 08/27/2014   Procedure: CESAREAN SECTION;  Surgeon: Maeola Sarah. Landry Mellow, MD;  Location: [facility];  Service: Obstetrics;  Laterality: N/A;  . DILATION AND CURETTAGE OF UTERUS    . MYOMECTOMY    . ROOT CANAL      Family History: Family History  Problem Relation Age of Onset  . Brain cancer Mother   . Heart disease Maternal Aunt   . Hypertension Maternal Aunt     Social History: Social History  Substance Use Topics  . Smoking status: Never Smoker  . Smokeless tobacco: Never Used  . Alcohol use No    Allergies: No Known Allergies  Meds:   Prescriptions Prior to Admission  Medication Sig Dispense Refill Last Dose  . acetaminophen (TYLENOL) 500 MG tablet Take 1,000 mg by mouth every 6 (six) hours as needed for mild pain, moderate pain or headache.    unknown at unknown  . hydrOXYzine (ATARAX/VISTARIL) 50 MG tablet Take 50 mg by mouth every 6 (six) hours as needed for itching.   unknown at unknown  . methylPREDNISolone (MEDROL) 4 MG tablet Take 4-24 mg by mouth daily. Pt is to take as a taper:    Six tablets on day 1, five tablets on day 2, four tablets on day 3,...3,2,1   unknown at unknown  . Prenatal Vit-Fe Fumarate-FA (PRENATAL MULTIVITAMIN) TABS tablet Take 1 tablet by mouth at bedtime.    unknown at unknown    ROS:  Review of Systems  Constitutional: Negative for chills, fatigue and fever.  Eyes: Negative for visual disturbance.  Respiratory: Negative for shortness of breath.   Cardiovascular: Negative for chest pain.  Gastrointestinal: Positive for abdominal pain. Negative for nausea and vomiting.  Genitourinary: Positive for pelvic pain. Negative for difficulty urinating, dysuria, flank pain, vaginal bleeding, vaginal discharge and vaginal pain.  Neurological: Negative for dizziness and headaches.  Psychiatric/Behavioral: Negative.      I have reviewed patient's Past Medical Hx, Surgical Hx, Family Hx, Social Hx, medications and allergies.   Physical Exam  No data found.  Constitutional: Well-developed, well-nourished female in no acute distress.  Cardiovascular: normal rate Respiratory: normal effort GI: Abd soft, non-tender, gravid appropriate for gestational age.  MS: Extremities nontender, no edema, normal ROM Neurologic: Alert and oriented x 4.  GU: Neg CVAT.   Dilation: 1.5 Effacement (%): 60 Presentation: Vertex Exam by:: Lisa l., cnm  FHT Baby A:  Baseline 150 , moderate variability, accelerations present, no decelerations FHT Baby B:  Baseline 150 , moderate variability, accelerations  present, no decelerations Contractions: q 3 mins, mild to palpation    MAU Course/MDM: Initially, pt was stable and plan was to give IV fluids and recheck cervix in 1 hour to determine if laboring. IV started and LR 1000 ml running. RN called CNM to notify her that pt water broke and she was seeing bloody fluid.  Fetal monitoring of both Baby A and Baby B were normal at that time.  Called Dr Cletis Media.  Plan to delivery pt by repeat C/S. OR notified and pt readied for C/S.  Bleeding increased with bright red bleeding noted on sheets around pt and then became difficult to assess FHR of both Baby A and Baby B.  Dr Cletis Media notified. Dr Rip Harbour, faculty practice attending notified and pt taken to OR for stat C/S.  Assessment: 1. Vaginal bleeding in pregnancy, third trimester   2. Dichorionic diamniotic twin pregnancy in third trimester     Plan: Admit to OR Stat C/S by Dr Gean Maidens Certified Nurse-Midwife 05/14/2016 11:26 PM

## 2016-05-14 NOTE — Anesthesia Procedure Notes (Signed)
Spinal  Patient location during procedure: OR Start time: 05/14/2016 11:11 PM End time: 05/14/2016 11:13 PM Staffing Anesthesiologist: Lyn Hollingshead Performed: anesthesiologist  Preanesthetic Checklist Completed: patient identified, site marked, surgical consent, pre-op evaluation, timeout performed, IV checked, risks and benefits discussed and monitors and equipment checked Spinal Block Patient position: sitting Prep: site prepped and draped and DuraPrep Patient monitoring: heart rate, cardiac monitor, continuous pulse ox and blood pressure Approach: midline Location: L3-4 Injection technique: single-shot Needle Needle type: Sprotte  Needle gauge: 24 G Needle length: 9 cm Needle insertion depth: 7 cm Assessment Sensory level: T4

## 2016-05-15 ENCOUNTER — Encounter (HOSPITAL_COMMUNITY): Payer: Self-pay | Admitting: *Deleted

## 2016-05-15 DIAGNOSIS — O4590 Premature separation of placenta, unspecified, unspecified trimester: Secondary | ICD-10-CM | POA: Diagnosis present

## 2016-05-15 DIAGNOSIS — Z98891 History of uterine scar from previous surgery: Secondary | ICD-10-CM

## 2016-05-15 LAB — CBC
HEMATOCRIT: 29 % — AB (ref 36.0–46.0)
HEMOGLOBIN: 9.5 g/dL — AB (ref 12.0–15.0)
MCH: 26.1 pg (ref 26.0–34.0)
MCHC: 32.8 g/dL (ref 30.0–36.0)
MCV: 79.7 fL (ref 78.0–100.0)
Platelets: 245 10*3/uL (ref 150–400)
RBC: 3.64 MIL/uL — ABNORMAL LOW (ref 3.87–5.11)
RDW: 14.2 % (ref 11.5–15.5)
WBC: 12.6 10*3/uL — ABNORMAL HIGH (ref 4.0–10.5)

## 2016-05-15 LAB — DIC (DISSEMINATED INTRAVASCULAR COAGULATION) PANEL
INR: 1.09
PLATELETS: 224 10*3/uL (ref 150–400)
SMEAR REVIEW: NONE SEEN

## 2016-05-15 LAB — DIC (DISSEMINATED INTRAVASCULAR COAGULATION)PANEL
D-Dimer, Quant: 4.64 ug/mL-FEU — ABNORMAL HIGH (ref 0.00–0.50)
Fibrinogen: 272 mg/dL (ref 210–475)
Prothrombin Time: 14.1 seconds (ref 11.4–15.2)
aPTT: 31 seconds (ref 24–36)

## 2016-05-15 LAB — PREPARE RBC (CROSSMATCH)

## 2016-05-15 LAB — RPR: RPR Ser Ql: NONREACTIVE

## 2016-05-15 MED ORDER — MEASLES, MUMPS & RUBELLA VAC ~~LOC~~ INJ
0.5000 mL | INJECTION | Freq: Once | SUBCUTANEOUS | Status: DC
  Filled 2016-05-15: qty 0.5

## 2016-05-15 MED ORDER — DIBUCAINE 1 % RE OINT: 1.0000 "application " | TOPICAL_OINTMENT | RECTAL | Status: DC | PRN

## 2016-05-15 MED ORDER — OXYCODONE-ACETAMINOPHEN 5-325 MG PO TABS: 2.0000 | ORAL_TABLET | ORAL | Status: DC | PRN

## 2016-05-15 MED ORDER — INFLUENZA VAC SPLIT QUAD 0.5 ML IM SUSY
0.5000 mL | PREFILLED_SYRINGE | INTRAMUSCULAR | Status: AC
  Administered 2016-05-16: 0.5 mL via INTRAMUSCULAR
  Filled 2016-05-15: qty 0.5

## 2016-05-15 MED ORDER — ACETAMINOPHEN 500 MG PO TABS
1000.0000 mg | ORAL_TABLET | Freq: Four times a day (QID) | ORAL | Status: AC
  Administered 2016-05-15 (×3): 1000 mg via ORAL
  Filled 2016-05-15 (×4): qty 2

## 2016-05-15 MED ORDER — PRENATAL MULTIVITAMIN CH
1.0000 | ORAL_TABLET | Freq: Every day | ORAL | Status: DC
  Administered 2016-05-15 – 2016-05-17 (×3): 1 via ORAL
  Filled 2016-05-15 (×3): qty 1

## 2016-05-15 MED ORDER — DIPHENHYDRAMINE HCL 25 MG PO CAPS: 25.0000 mg | ORAL_CAPSULE | ORAL | Status: DC | PRN

## 2016-05-15 MED ORDER — HYDROMORPHONE HCL 1 MG/ML IJ SOLN
0.2500 mg | INTRAMUSCULAR | Status: DC | PRN
  Administered 2016-05-15: 0.5 mg via INTRAVENOUS

## 2016-05-15 MED ORDER — MEPERIDINE HCL 25 MG/ML IJ SOLN: 6.2500 mg | INTRAMUSCULAR | Status: DC | PRN

## 2016-05-15 MED ORDER — OXYCODONE-ACETAMINOPHEN 5-325 MG PO TABS
1.0000 | ORAL_TABLET | ORAL | Status: DC | PRN
  Administered 2016-05-15 – 2016-05-17 (×8): 1 via ORAL
  Filled 2016-05-15 (×8): qty 1

## 2016-05-15 MED ORDER — SCOPOLAMINE 1 MG/3DAYS TD PT72
1.0000 | MEDICATED_PATCH | Freq: Once | TRANSDERMAL | Status: DC
  Filled 2016-05-15: qty 1

## 2016-05-15 MED ORDER — LACTATED RINGERS IV SOLN
Freq: Once | INTRAVENOUS | Status: AC
  Administered 2016-05-15: 18:00:00 via INTRAVENOUS

## 2016-05-15 MED ORDER — DIPHENHYDRAMINE HCL 25 MG PO CAPS: 25.0000 mg | ORAL_CAPSULE | Freq: Four times a day (QID) | ORAL | Status: DC | PRN

## 2016-05-15 MED ORDER — LACTATED RINGERS IV SOLN
INTRAVENOUS | Status: DC
  Administered 2016-05-15: 15:00:00 via INTRAVENOUS

## 2016-05-15 MED ORDER — MENTHOL 3 MG MT LOZG: 1.0000 | LOZENGE | OROMUCOSAL | Status: DC | PRN

## 2016-05-15 MED ORDER — SENNOSIDES-DOCUSATE SODIUM 8.6-50 MG PO TABS
2.0000 | ORAL_TABLET | ORAL | Status: DC
  Administered 2016-05-15 – 2016-05-16 (×2): 2 via ORAL
  Filled 2016-05-15 (×2): qty 2

## 2016-05-15 MED ORDER — SODIUM CHLORIDE 0.9% FLUSH: 3.0000 mL | INTRAVENOUS | Status: DC | PRN

## 2016-05-15 MED ORDER — WITCH HAZEL-GLYCERIN EX PADS: 1.0000 "application " | MEDICATED_PAD | CUTANEOUS | Status: DC | PRN

## 2016-05-15 MED ORDER — SIMETHICONE 80 MG PO CHEW
80.0000 mg | CHEWABLE_TABLET | Freq: Three times a day (TID) | ORAL | Status: DC
  Administered 2016-05-15 – 2016-05-16 (×4): 80 mg via ORAL
  Filled 2016-05-15 (×5): qty 1

## 2016-05-15 MED ORDER — COCONUT OIL OIL: 1.0000 "application " | TOPICAL_OIL | Status: DC | PRN

## 2016-05-15 MED ORDER — NALOXONE HCL 0.4 MG/ML IJ SOLN: 0.4000 mg | INTRAMUSCULAR | Status: DC | PRN

## 2016-05-15 MED ORDER — IBUPROFEN 600 MG PO TABS
600.0000 mg | ORAL_TABLET | Freq: Four times a day (QID) | ORAL | Status: DC | PRN
  Administered 2016-05-15: 600 mg via ORAL

## 2016-05-15 MED ORDER — METHYLERGONOVINE MALEATE 0.2 MG/ML IJ SOLN: 0.2000 mg | INTRAMUSCULAR | Status: DC | PRN

## 2016-05-15 MED ORDER — SIMETHICONE 80 MG PO CHEW
80.0000 mg | CHEWABLE_TABLET | ORAL | Status: DC
  Administered 2016-05-15: 80 mg via ORAL
  Filled 2016-05-15 (×2): qty 1

## 2016-05-15 MED ORDER — DIPHENHYDRAMINE HCL 50 MG/ML IJ SOLN: 12.5000 mg | INTRAMUSCULAR | Status: DC | PRN

## 2016-05-15 MED ORDER — FERROUS SULFATE 325 (65 FE) MG PO TABS
325.0000 mg | ORAL_TABLET | Freq: Two times a day (BID) | ORAL | Status: DC
  Administered 2016-05-15 – 2016-05-17 (×6): 325 mg via ORAL
  Filled 2016-05-15 (×6): qty 1

## 2016-05-15 MED ORDER — ONDANSETRON HCL 4 MG/2ML IJ SOLN: 4.0000 mg | Freq: Three times a day (TID) | INTRAMUSCULAR | Status: DC | PRN

## 2016-05-15 MED ORDER — OXYTOCIN 40 UNITS IN LACTATED RINGERS INFUSION - SIMPLE MED
2.5000 [IU]/h | INTRAVENOUS | Status: DC
  Administered 2016-05-15: 2.5 [IU]/h via INTRAVENOUS
  Filled 2016-05-15: qty 1000

## 2016-05-15 MED ORDER — NALBUPHINE HCL 10 MG/ML IJ SOLN: 5.0000 mg | INTRAMUSCULAR | Status: DC | PRN

## 2016-05-15 MED ORDER — IBUPROFEN 600 MG PO TABS
600.0000 mg | ORAL_TABLET | Freq: Four times a day (QID) | ORAL | Status: DC
  Administered 2016-05-15 – 2016-05-17 (×9): 600 mg via ORAL
  Filled 2016-05-15 (×10): qty 1

## 2016-05-15 MED ORDER — TETANUS-DIPHTH-ACELL PERTUSSIS 5-2.5-18.5 LF-MCG/0.5 IM SUSP
0.5000 mL | Freq: Once | INTRAMUSCULAR | Status: AC
  Administered 2016-05-17: 0.5 mL via INTRAMUSCULAR

## 2016-05-15 MED ORDER — PROMETHAZINE HCL 25 MG/ML IJ SOLN: 6.2500 mg | INTRAMUSCULAR | Status: DC | PRN

## 2016-05-15 MED ORDER — LACTATED RINGERS IV BOLUS (SEPSIS)
500.0000 mL | Freq: Once | INTRAVENOUS | Status: AC
  Administered 2016-05-15: 500 mL via INTRAVENOUS

## 2016-05-15 MED ORDER — NALBUPHINE HCL 10 MG/ML IJ SOLN: 5.0000 mg | Freq: Once | INTRAMUSCULAR | Status: DC | PRN

## 2016-05-15 MED ORDER — ACETAMINOPHEN 325 MG PO TABS: 650.0000 mg | ORAL_TABLET | ORAL | Status: DC | PRN

## 2016-05-15 MED ORDER — HYDROMORPHONE HCL 1 MG/ML IJ SOLN
INTRAMUSCULAR | Status: AC
  Filled 2016-05-15: qty 1

## 2016-05-15 MED ORDER — SIMETHICONE 80 MG PO CHEW
80.0000 mg | CHEWABLE_TABLET | ORAL | Status: DC | PRN
Start: 2016-05-15 — End: 2016-05-17

## 2016-05-15 MED ORDER — METHYLERGONOVINE MALEATE 0.2 MG PO TABS: 0.2000 mg | ORAL_TABLET | ORAL | Status: DC | PRN

## 2016-05-15 MED ORDER — ZOLPIDEM TARTRATE 5 MG PO TABS: 5.0000 mg | ORAL_TABLET | Freq: Every evening | ORAL | Status: DC | PRN

## 2016-05-15 MED ORDER — NALOXONE HCL 2 MG/2ML IJ SOSY
1.0000 ug/kg/h | PREFILLED_SYRINGE | INTRAVENOUS | Status: DC | PRN
  Filled 2016-05-15: qty 2

## 2016-05-15 MED ORDER — OXYTOCIN 40 UNITS IN LACTATED RINGERS INFUSION - SIMPLE MED: 2.5000 [IU]/h | INTRAVENOUS | Status: AC

## 2016-05-15 NOTE — Progress Notes (Signed)
UR chart review completed.  

## 2016-05-15 NOTE — Progress Notes (Signed)
Spoke to [employer] CNM about patient's output=200cc at this time. Plan to let patient ambulate to bathroom to d/c foley per MD order. No new orders received

## 2016-05-15 NOTE — Transfer of Care (Signed)
Immediate Anesthesia Transfer of Care Note  Patient: Tiffany Solomon  Procedure(s) Performed: Procedure(s): CESAREAN SECTION (N/A)  Patient Location: PACU  Anesthesia Type:Spinal  Level of Consciousness: awake, alert  and oriented  Airway & Oxygen Therapy: Patient Spontanous Breathing  Post-op Assessment: Report given to RN and Post -op Vital signs reviewed and stable  Post vital signs: Reviewed and stable  Last Vitals: There were no vitals filed for this visit.  Last Pain:  Vitals:   05/14/16 2118  PainSc: 8          Complications: No apparent anesthesia complications

## 2016-05-15 NOTE — Progress Notes (Signed)
Notified Dr Alesia Richards of pt tachycardia. HR 110 lying and 130 standing BP Stable. Pt was not dizzy when standing. Pt urine output 59m/ over 2 hours. New orders received.

## 2016-05-15 NOTE — Clinical Social Work Maternal (Signed)
CLINICAL SOCIAL WORK MATERNAL/CHILD NOTE  Patient Details  Name: Tiffany Solomon MRN: [id number] Date of Birth: February 13, 2018  Date:  09/28/2015  Clinical Social Worker Initiating Note:  Ferdinand Lango Kaire Stary, MSW, LCSW-A  Date/ Time Initiated:  05/15/16/1415         Child's Name:  Unknown    Legal Guardian:  Other (Comment) (Not established by court system. MOB and FOB parent collectively )   Need for Interpreter:  None   Date of Referral:  09-23-15     Reason for Referral:  Other (Comment) (MOB hx of depression )   Referral Source:  Physician   Address:  [place], [place] [address]  Phone number:  [phone]   Household Members: Self, Significant Other   Natural Supports (not living in the home): Immediate Family, Extended Family, Friends   Professional Supports:    Employment:    Type of Work:     Education:      Financial Resources:Medicaid   Other Resources:     Cultural/Religious Considerations Which May Impact Care: None reported.   Strengths: Ability to meet basic needs , Pediatrician chosen , Home prepared for child    Risk Factors/Current Problems: Family/Relationship Issues  (MOB and FOB relationship has a hx of stressors )   Cognitive State: Able to Concentrate , Alert , Goal Oriented , Insightful    Mood/Affect: Calm , Interested , Comfortable    CSW Assessment:CSW met with MOB at bedside to complete assessment. This Probation officer explained role and reasoning for visit being due to MO hx of depression. MOB confirms that she was experiencing some depression at the beginning of her pregnancy after finding out she was expecting twins. She further notes now she is fine and is not experiencing those emotions anymore. This Probation officer discussed PPD and SIDS with MOB. She verbalized understanding. At this time no other needs addressed or requested. Case closed to this CSW.    CSW Plan/Description: No Further  Intervention Required/No Barriers to Discharge    Oda Cogan, MSW, [facility]  Office: [phone]

## 2016-05-15 NOTE — Progress Notes (Signed)
PRESSURE DRESSING REMOVED PER MD REQUEST. HONEYCOMB DRESSING WITH MORE THAN 50% DRAINAGE NOTED. NEW HONEYCOMB PLACED WITH ASEPTIC TECHNIQUE.

## 2016-05-15 NOTE — Progress Notes (Addendum)
.  np Subjective: Postpartum Day 1: Cesarean Delivery Patient reports tolerating PO and no problems voiding.    Objective: Vital signs in last 24 hours: Temp:  [97.5 F (36.4 C)-99.1 F (37.3 C)] 97.5 F (36.4 C) (10/15 0600) Pulse Rate:  [79-113] 91 (10/15 0600) Resp:  [13-36] 18 (10/15 0600) BP: (106-152)/(71-105) 121/74 (10/15 0600) SpO2:  [96 %-100 %] 96 % (10/15 0600)  Physical Exam:  General: alert, cooperative and no distress Lochia: appropriate Uterine Fundus: firm Incision: covered with pressure dressing with honey coomb underneath DVT Evaluation: No evidence of DVT seen on physical exam.   Recent Labs  05/14/16 2333 05/15/16 0436  HGB 8.3* 9.5*  HCT 25.2* 29.0*    Assessment/Plan: Status post Cesarean section. Doing well postoperatively.  Continue current care. Start feso4 Pleas Koch Prothero 05/15/2016, 10:08 AM  I saw and examined patient at bedside and agree with above findings, assessment and plan.  Patient still requesting BTL, unable to find consent form in epic, will follow up with [facility] MDs tomorrow about BTL.  Dr. Alesia Richards.

## 2016-05-15 NOTE — Lactation Note (Signed)
This note was copied from a baby's chart. Lactation Consultation Note  Patient Name: Tiffany Solomon, PAUP [id number] Date: 05/15/2016 Reason for consult: Initial assessment;Multiple gestation;Late preterm infant 35.0 week twin boys at 36 hours of age. Baby A - not latching but taking supplement by finger feeding using curved tipped syringe. Baby supplemented 4 times 5-10 ml. At this visit baby awake and giving feeding ques but fell asleep when trying to latch. With suck exam baby does not have strong suckle yet, some disorganization to suck. Very sleepy.   Baby B - spitty today - emesis X 3, had not latched either, supplemented X 3 5-10 ml. Baby B very sleepy, attempted to hand express to get some colostrum but only received few drops. Attempted to supplement this baby by finger feeding using curved tipped syringe - baby took approx 2 ml but spit back up some of this.   Discussed with Mom LPT behaviors and the importance of feeding babies whenever she observes feeding ques, but not longer than every 3 hours. Discussed sleepiness at breast and stimulation needed to keep babies actively nursing.  Try to latch each baby with each feeding if they will awaken to latch. Advised the goal is to have them at breast 8-12 times or more in 24 hours. Limit time at breast to 30 minutes for each baby.  Since babies are LPT and not latching, supplement every 3 hours according to LPT guidelines - 5-10 ml today. Can use curved tipped syringe but will need to switch to bottles once Baby B not so gaggy and as the supplemental amounts increase each day. Bottle is Mom's choice. Post pump for 15 minutes to encourage milk production and to have EBM to supplement when available. FOB present and to give supplement while Mom post pumps after BF. Handout reviewing LPT behaviors given to MOm. Encouraged to call for assist with latch till babies awakening and sustaining the latch.    Maternal Data Has patient been taught Hand  Expression?: Yes Does the patient have breastfeeding experience prior to this delivery?: Yes  Feeding Feeding Type: Breast Fed Length of feed: 0 min  LATCH Score/Interventions Latch: Too sleepy or reluctant, no latch achieved, no sucking elicited.                    Lactation Tools Discussed/Used Tools: Pump Breast pump type: Double-Electric Breast Pump Pump Review: Setup, frequency, and cleaning Initiated by:: RN Date initiated:: 05/15/16   Consult Status Consult Status: Follow-up Date: 05/16/16 Follow-up type: In-patient    Katrine Coho 05/15/2016, 3:39 PM

## 2016-05-16 MED ORDER — MEDROXYPROGESTERONE ACETATE 150 MG/ML IM SUSP: 150.0000 mg | Freq: Once | INTRAMUSCULAR | Status: DC

## 2016-05-16 NOTE — Lactation Note (Addendum)
This note was copied from a baby's chart. Lactation Consultation Note Has reached 24 hrs. Baby "A" has been feeding better than baby "B". Baby has been latching on well per mom then being supplemented w/formula. Reviewed LPI feeding habits, not letting baby's feed over 30 min. D/t conserving calories. Asked mom regarding using DEBP, stated she has pumped today but she can't do it every three hours like LC wants d/t she is very tired. Mom states she understands the importance of pump but physically she is unable to do it at this time.  Discussed cluster feeding, STS, hand expressing, supply and demand. Mom states when she pumps or hand expresses nothing comes out but a drop. Discussed consistency of colostrum. Encouraged after DEBP to hand express. Encouraged mom to rest when she can. Call [facility] if needs assistance. Patient Name: Tiffany Solomon, Tiffany Solomon [id number] Date: 05/16/2016 Reason for consult: Follow-up assessment;Multiple gestation;Infant < 6lbs;Late preterm infant   Maternal Data    Feeding Feeding Type: Formula Length of feed: 20 min  LATCH Score/Interventions Latch: Grasps breast easily, tongue down, lips flanged, rhythmical sucking. Intervention(s): Breast compression  Audible Swallowing: A few with stimulation Intervention(s): Skin to skin;Hand expression  Type of Nipple: Everted at rest and after stimulation  Comfort (Breast/Nipple): Soft / non-tender     Hold (Positioning): No assistance needed to correctly position infant at breast.  LATCH Score: 9  Lactation Tools Discussed/Used     Consult Status Consult Status: Follow-up Date: 05/17/16 Follow-up type: In-patient    Theodoro Kalata 05/16/2016, 12:17 AM

## 2016-05-16 NOTE — Progress Notes (Signed)
Subjective: Postpartum Day 2: Cesarean Delivery Patient reports tolerating PO, + flatus and no problems voiding.  Patient is interested in [place] for contraception.   Objective: Vital signs in last 24 hours: Temp:  [97.4 F (36.3 C)-98.5 F (36.9 C)] 98.3 F (36.8 C) (10/16 0620) Pulse Rate:  [72-130] 72 (10/16 0620) Resp:  [16-20] 19 (10/16 0620) BP: (107-134)/(58-82) 107/64 (10/16 0620) SpO2:  [97 %-98 %] 98 % (10/16 0005)  Physical Exam:  General: alert and cooperative Lochia: appropriate Uterine Fundus: firm Incision: healing well DVT Evaluation: No evidence of DVT seen on physical exam.   Recent Labs  05/14/16 2333 05/15/16 0436  HGB 8.3* 9.5*  HCT 25.2* 29.0*    Assessment/Plan: Status post Cesarean section. Doing well postoperatively.  Continue current care D/W pt options of postpartum btl vs laparoscopic btl in 6-8 wks... She is agreeable to laparoscopic btl in 6-8 wks.  Recommend depoprovera prior to discharge for contraception until btl can be performed. Christophe Louis J. 05/16/2016, 8:10 AM

## 2016-05-16 NOTE — Lactation Note (Signed)
This note was copied from a baby's chart. Lactation Consultation Note Baby"B" has started BF well. Had been spitty, since resolved now hungry. At breast BF when LC entered room. In football position w/wide flange, heard swallows, a few times gulping. Adjusted cheeks to breast. Mom supplementing after feeding. Patient Name: Leshly Mann [id number] Date: 05/16/2016 Reason for consult: Follow-up assessment;Infant < 6lbs;Late preterm infant;Multiple gestation   Maternal Data    Feeding Feeding Type: Breast Fed Length of feed: 20 min  LATCH Score/Interventions Latch: Grasps breast easily, tongue down, lips flanged, rhythmical sucking.  Audible Swallowing: Spontaneous and intermittent Intervention(s): Hand expression;Alternate breast massage  Type of Nipple: Everted at rest and after stimulation  Comfort (Breast/Nipple): Soft / non-tender     Hold (Positioning): No assistance needed to correctly position infant at breast. Intervention(s): Support Pillows;Skin to skin  LATCH Score: 10  Lactation Tools Discussed/Used Pump Review: Setup, frequency, and cleaning;Milk Storage Initiated by:: RN Date initiated:: 05/15/16   Consult Status Consult Status: Follow-up Date: 05/17/16 Follow-up type: In-patient    Theodoro Kalata 05/16/2016, 12:27 AM

## 2016-05-16 NOTE — Anesthesia Postprocedure Evaluation (Signed)
Anesthesia Post Note  Patient: Tiffany Solomon  Procedure(s) Performed: Procedure(s) (LRB): CESAREAN SECTION (N/A)  Patient location during evaluation: Mother Baby Anesthesia Type: Spinal Level of consciousness: awake and alert and oriented Pain management: satisfactory to patient Vital Signs Assessment: post-procedure vital signs reviewed and stable Respiratory status: spontaneous breathing and nonlabored ventilation Cardiovascular status: stable Postop Assessment: no headache, no backache, patient able to bend at knees, no signs of nausea or vomiting and adequate PO intake Anesthetic complications: no     Last Vitals:  Vitals:   05/16/16 0005 05/16/16 0620  BP: 125/74 107/64  Pulse: 88 72  Resp: 18 19  Temp: 36.9 C 36.8 C    Last Pain:  Vitals:   05/16/16 0730  TempSrc:   PainSc: 0-No pain   Pain Goal: Patients Stated Pain Goal: 3 (05/15/16 1440)               Willa Rough

## 2016-05-16 NOTE — Anesthesia Postprocedure Evaluation (Signed)
Anesthesia Post Note  Patient: Tiffany Solomon  Procedure(s) Performed: Procedure(s) (LRB): CESAREAN SECTION (N/A)  Patient location during evaluation: PACU Anesthesia Type: Spinal Level of consciousness: awake Pain management: pain level controlled Vital Signs Assessment: post-procedure vital signs reviewed and stable Respiratory status: spontaneous breathing Cardiovascular status: stable Postop Assessment: no headache, no backache, spinal receding, patient able to bend at knees and no signs of nausea or vomiting Anesthetic complications: no     Last Vitals:  Vitals:   05/16/16 0620 05/16/16 1734  BP: 107/64 113/72  Pulse: 72 86  Resp: 19 18  Temp: 36.8 C     Last Pain:  Vitals:   05/16/16 1605  TempSrc:   PainSc: 0-No pain   Pain Goal: Patients Stated Pain Goal: 3 (05/15/16 1440)               Kamron Portee JR,JOHN Samad Thon

## 2016-05-16 NOTE — Lactation Note (Signed)
This note was copied from a baby's chart. Lactation Consultation Note  Patient Name: Tiffany Solomon, Tiffany Solomon [id number] Date: 05/16/2016  LPI twins 67 hours old. Mom reports that she attempted to nurse her first child, born last year per mom, but was not able to produce enough breast milk. Mom states that she is exhausted from attempting to nurse both babies. Mom reports that she and FOBs are giving formula by bottle today. Mom states that she is just not able to pump at this time. Mom reports that she has to return to work and doesn't know how long she would realistically be able to nurse. However, mom would like for the babies to receive colostrum at least. Discussed putting one baby to breast at each feeding before supplementing and discussed hand expression. Enc mom to call for assistance as needed.     Maternal Data    Feeding Feeding Type: Bottle Fed - Formula Nipple Type: Slow - flow  LATCH Score/Interventions                      Lactation Tools Discussed/Used     Consult Status      Andres Labrum 05/16/2016, 2:50 PM

## 2016-05-17 MED ORDER — OXYCODONE-ACETAMINOPHEN 5-325 MG PO TABS: 1.0000 | ORAL_TABLET | ORAL | 0 refills | Status: DC | PRN

## 2016-05-17 MED ORDER — FERROUS SULFATE 325 (65 FE) MG PO TABS: 325.0000 mg | ORAL_TABLET | Freq: Every day | ORAL | 0 refills | Status: DC

## 2016-05-17 MED ORDER — IBUPROFEN 600 MG PO TABS: 600.0000 mg | ORAL_TABLET | Freq: Four times a day (QID) | ORAL | 1 refills | Status: DC | PRN

## 2016-05-17 NOTE — Discharge Summary (Signed)
OB Discharge Summary     Patient Name: Tiffany Solomon DOB: October 31, 1985 MRN: [id number]  Date of admission: 05/14/2016 Delivering MD:    Khiyah, Maben [id number]  RIVARD, SANDRA   Azayla, Pankonin [facility] [id number]  Delsa Bern   Date of discharge: 05/17/2016  Admitting diagnosis: 11 WKS, CTXS 2)Di/Di twin pregnancy  3/) h/o myomectomy 4) h/o cesarean section   Intrauterine pregnancy: 110w0d     Secondary diagnosis:  Active Problems:   Placental abruption  Additional problems: Anemia      Discharge diagnosis: Preterm Pregnancy Delivered                                                                                                Post partum procedures:None  Augmentation: NA  Complications: Placental Abruption  Hospital course:  Onset of Labor With Unplanned C/S  30 y.o. yo [id number] at [redacted]w[redacted]d was admitted in Latent Labor  on 05/14/2016. Patient had a labor course significant for placental abruption . Membrane Rupture Time/Date:    Yanieliz, [place] [id number]  10:55 PM   Makinzi, Kemmerling [facility] [id number]  10:55 PM ,   Cantrell, Mcginniss [id number]  05/14/2016   Careena, Alvidrez [facility] [id number]  05/14/2016   The patient went for cesarean section due to Prior Uterine Surgery and placental abruption , and delivered a Viable infant,   Brianne, Sobie [id number]  05/14/2016   Kaisy, Jakab [facility] [id number]  05/14/2016  Details of operation can be found in separate operative note. Patient had an uncomplicated postpartum course.  She is ambulating,tolerating a regular diet, passing flatus, and urinating well.  Patient is discharged home in stable condition 05/17/16.   Physical exam Vitals:   05/16/16 0005 05/16/16 0620 05/16/16 1734 05/17/16 0645  BP: 125/74 107/64 113/72 (!) 111/58  Pulse: 88 72 86 91  Resp: 18 19 18 17   Temp: 98.5 F (36.9 C) 98.3 F (36.8 C)  97.8 F (36.6 C)  TempSrc: Oral Oral  Oral  SpO2: 98%       General: alert, cooperative and no distress Lochia: appropriate Uterine Fundus: firm Incision: Healing well with no significant drainage DVT Evaluation: No evidence of DVT seen on physical exam. Labs: Lab Results  Component Value Date   WBC 12.6 (H) 05/15/2016   HGB 9.5 (L) 05/15/2016   HCT 29.0 (L) 05/15/2016   MCV 79.7 05/15/2016   PLT 245 05/15/2016   CMP Latest Ref Rng & Units 02/17/2016  Glucose 65 - 99 mg/dL 98  BUN 6 - 20 mg/dL <5(L)  Creatinine 0.44 - 1.00 mg/dL 0.53  Sodium 135 - 145 mmol/L 135  Potassium 3.5 - 5.1 mmol/L 3.4(L)  Chloride 101 - 111 mmol/L 107  CO2 22 - 32 mmol/L 20(L)  Calcium 8.9 - 10.3 mg/dL 8.9  Total Protein 6.5 - 8.1 g/dL 7.0  Total Bilirubin 0.3 - 1.2 mg/dL 0.8  Alkaline Phos 38 - 126 U/L 115  AST 15 - 41 U/L 16  ALT 14 - 54 U/L 14    Discharge instruction: per After Visit Summary and "Baby and  Me Booklet".  After visit meds: Ibuprofen / percocet/ iron   Diet: routine diet  Activity: Advance as tolerated. Pelvic rest for 6 weeks.   Outpatient follow up:2 weeks Follow up Appt:Future Appointments Date Time Provider [facility]  05/20/2016 10:30 AM [facility] PAT 5 [facility] None   Follow up Visit:No Follow-up on file.  Postpartum contraception: Depo Provera  Newborn Data:   Shemaiah, Demars [id number]  Live born female  Birth Weight: 6 lb 8.6 oz (2965 g) APGAR: 7, 9   Malaiya, Foister [id number]  Live born female  Birth Weight: 7 lb 1.8 oz (3225 g) APGAR: 7, 7  Baby Feeding: Bottle Disposition:home with mother   05/17/2016 Catha Brow., MD

## 2016-05-17 NOTE — Discharge Instructions (Signed)
Cesarean Delivery Cesarean delivery is the birth of a baby through a cut (incision) in the abdomen and womb (uterus).  LET YOUR HEALTH CARE PROVIDER KNOW ABOUT:  All medicines you are taking, including vitamins, herbs, eye drops, creams, and over-the-counter medicines.  Previous problems you or members of your family have had with the use of anesthetics.  Any bleeding or blood clotting disorders you have.  Family history of blood clots or bleeding disorders.  Any history of deep vein thrombosis (DVT) or pulmonary embolism (PE).  Previous surgeries you have had.  Medical conditions you have.  Any allergies you have.  Complicationsinvolving the pregnancy. RISKS AND COMPLICATIONS  Generally, this is a safe procedure. However, as with any procedure, complications can occur. Possible complications include:  Bleeding.  Infection.  Blood clots.  Injury to surrounding organs.  Problems with anesthesia.  Injury to the baby. BEFORE THE PROCEDURE   You may be given an antacid medicine to drink. This will prevent acid contents in your stomach from going into your lungs if you vomit during the surgery.  You may be given an antibiotic medicine to prevent infection. PROCEDURE   To prevent infection of your incision:  Hair may be removed from your pubic area if it is near your incision.  The skin of your pubic area and lower abdomen will be cleaned with a germ-killing solution (antiseptic).  A tube (Foley catheter) will be placed in your bladder to drain your urine from your bladder into a bag. This keeps your bladder empty during surgery.  An IV tube will be placed in your vein.  You may be given medicine to numb the lower half of your body (regional anesthetic). If you were in labor, you may have already had an epidural in place which can be used in both labor and cesarean delivery. You may possibly be given medicine to make you sleep (general anesthetic) though this is not as  common.  Your heart rate and your baby's heart rate will be monitored.  An incision will be made in your abdomen that extends to your uterus. There are 2 basic kinds of incisions:  The horizontal (transverse) incision. Horizontal incisions are from side to side and are used for most routine cesarean deliveries.  The vertical incision. The vertical incision is from the top of the abdomen to the bottom and is less commonly used. It is often done for women who have a serious complication (extreme prematurity) or under emergency situations.  The horizontal and vertical incisions may both be used at the same time. However, this is very uncommon.  An incision is then made in your uterus to deliver the baby.  Your baby will be delivered.  Your health care provider may place the baby on your chest. It is important to keep the baby warm. Your health care provider will dry off the baby, place the baby directly on your bare skin, and cover the baby with warm, dry blankets.  Both incisions will be closed with absorbable stitches. AFTER THE PROCEDURE   If you were awake during the surgery, you will see your baby right away. If you were asleep, you will see your baby as soon as you are awake.  You may breastfeed your baby after surgery.  You may be able to get up and walk the same day as the surgery. If you need to stay in bed for a period of time, you will receive help to turn, cough, and take deep breaths after   surgery. This helps prevent lung problems such as pneumonia.  Do not get out of bed alone the first time after surgery. You will need help getting out of bed until you are able to do this by yourself.  You may be able to shower the day after your cesarean delivery. After the bandage (dressing) is taken off the incision site, a nurse will assist you to shower if you would like help.  You may be directed to take actions to help prevent blood clots in your legs. These may  include:  Walking shortly after surgery, with someone assisting you. Moving around after surgery helps to improve blood flow.  Wearing compression stockings or using different types of devices.  Taking medicines to thin your blood (anticoagulants) if you are at high risk for DVT or PE.  Save any blood clots that you pass from your vagina. If you pass a clot while on the toilet, do not flush it. Call for the nurse. Tell the nurse if you think you are bleeding too much or passing too many clots.  You will be given medicine for pain and nausea as needed. Let your health care providers know if you are hurting. You may also be given an antibiotic to prevent an infection.  Your IV tube will be taken out when you are drinking a reasonable amount of fluids. The Foley catheter is taken out when you are up and walking.  If your blood type is Rh negative and your baby's blood type is Rh positive, you will be given a shot of anti-D immune globulin. This shot prevents you from having Rh problems with a future pregnancy. You should get the shot even if you had your tubes tied (tubal ligation).  If you are allowed to take the baby for a walk, place the baby in the bassinet and push it.   This information is not intended to replace advice given to you by your health care provider. Make sure you discuss any questions you have with your health care provider.   Document Released: 07/18/2005 Document Revised: 04/08/2015 Document Reviewed: 03/14/2012 Elsevier Interactive Patient Education 2016 Elsevier Inc.  

## 2016-05-18 ENCOUNTER — Ambulatory Visit: Payer: Self-pay

## 2016-05-18 LAB — TYPE AND SCREEN
ABO/RH(D): A POS
ANTIBODY SCREEN: NEGATIVE
UNIT DIVISION: 0
UNIT DIVISION: 0
UNIT DIVISION: 0
Unit division: 0
Unit division: 0
Unit division: 0

## 2016-05-18 NOTE — Lactation Note (Signed)
This note was copied from a baby's chart. Lactation Consultation Note  Patient Name: Tiffany Solomon [id number] Date: 05/18/2016 Reason for consult: Follow-up assessment;Late preterm infant (elevating bilirubin.) LPI twins. Mom reports that she is putting babies to breast sporadically so that they can receive some colostrum. However, mom has not been using DEBP and states that while she likes the idea of pumping, she simply does not want to do it. Mom reports that she will be returning to work as soon as she is cleared by her provider--she is hoping at 6 weeks, and she has her toddler at home as well (will be 2 in January). Mom reports that baby Boy 'B' is taking less volume than baby Boy 'A' and will have a serum bili drawn tomorrow (05-18-16). Discussed feeding baby Boy 'B' first. Also discussed making sure baby awake during feeding--changing the diaper and feeding STS. Discussed with mom that elevated bilirubin can cause baby to be sleepy, and discussed checking the whites of the baby's eyes to check for jaundice. Mom aware of [place] phone line assistance after D/C.    Maternal Data Has patient been taught Hand Expression?: Yes Does the patient have breastfeeding experience prior to this delivery?: Yes  Feeding    LATCH Score/Interventions                      Lactation Tools Discussed/Used     Consult Status Consult Status: Complete    Andres Labrum 05/18/2016, 9:15 AM

## 2016-05-20 ENCOUNTER — Encounter (HOSPITAL_COMMUNITY)
Admission: RE | Admit: 2016-05-20 | Discharge: 2016-05-20 | Disposition: A | Discharge status: Home / Self Care | Payer: 59 | Source: Ambulatory Visit

## 2016-05-20 HISTORY — DX: Encounter for other specified aftercare: Z51.89

## 2016-05-22 ENCOUNTER — Encounter (HOSPITAL_COMMUNITY): Payer: Self-pay | Admitting: Emergency Medicine

## 2016-05-22 ENCOUNTER — Encounter (HOSPITAL_COMMUNITY): Payer: Self-pay | Admitting: Anesthesiology

## 2016-05-23 ENCOUNTER — Encounter (HOSPITAL_COMMUNITY): Admission: RE | Payer: Self-pay | Source: Ambulatory Visit

## 2016-05-23 ENCOUNTER — Inpatient Hospital Stay (HOSPITAL_COMMUNITY): Admission: RE | Admit: 2016-05-23 | Payer: 59 | Source: Ambulatory Visit | Admitting: Obstetrics and Gynecology

## 2016-05-23 SURGERY — Surgical Case
Anesthesia: Choice

## 2016-05-23 MED ORDER — OXYTOCIN 10 UNIT/ML IJ SOLN
INTRAMUSCULAR | Status: AC
  Filled 2016-05-23: qty 4

## 2016-05-23 MED ORDER — ONDANSETRON HCL 4 MG/2ML IJ SOLN
INTRAMUSCULAR | Status: AC
  Filled 2016-05-23: qty 2

## 2016-05-25 ENCOUNTER — Encounter (HOSPITAL_COMMUNITY): Payer: Self-pay

## 2016-05-25 ENCOUNTER — Inpatient Hospital Stay (HOSPITAL_COMMUNITY)
Admission: AD | Admit: 2016-05-25 | Discharge: 2016-05-27 | DRG: 776 | Disposition: A | Discharge status: Home / Self Care | Payer: Medicaid Other | Source: Ambulatory Visit | Attending: Obstetrics and Gynecology | Admitting: Obstetrics and Gynecology

## 2016-05-25 DIAGNOSIS — F53 Puerperal psychosis: Secondary | ICD-10-CM | POA: Diagnosis present

## 2016-05-25 DIAGNOSIS — O1495 Unspecified pre-eclampsia, complicating the puerperium: Principal | ICD-10-CM | POA: Diagnosis present

## 2016-05-25 DIAGNOSIS — F418 Other specified anxiety disorders: Secondary | ICD-10-CM | POA: Diagnosis present

## 2016-05-25 DIAGNOSIS — O99345 Other mental disorders complicating the puerperium: Secondary | ICD-10-CM | POA: Diagnosis present

## 2016-05-25 LAB — COMPREHENSIVE METABOLIC PANEL
ALBUMIN: 3 g/dL — AB (ref 3.5–5.0)
ALT: 25 U/L (ref 14–54)
ANION GAP: 4 — AB (ref 5–15)
AST: 23 U/L (ref 15–41)
Alkaline Phosphatase: 221 U/L — ABNORMAL HIGH (ref 38–126)
BUN: 6 mg/dL (ref 6–20)
CHLORIDE: 107 mmol/L (ref 101–111)
CO2: 28 mmol/L (ref 22–32)
Calcium: 8.8 mg/dL — ABNORMAL LOW (ref 8.9–10.3)
Creatinine, Ser: 0.63 mg/dL (ref 0.44–1.00)
GFR calc Af Amer: >60 mL/min (ref >60)
GFR calc non Af Amer: >60 mL/min (ref >60)
GLUCOSE: 90 mg/dL (ref 65–99)
POTASSIUM: 3.6 mmol/L (ref 3.5–5.1)
SODIUM: 139 mmol/L (ref 135–145)
TOTAL PROTEIN: 6 g/dL — AB (ref 6.5–8.1)
Total Bilirubin: 0.7 mg/dL (ref 0.3–1.2)

## 2016-05-25 LAB — CBC
HEMATOCRIT: 31.7 % — AB (ref 36.0–46.0)
HEMOGLOBIN: 10 g/dL — AB (ref 12.0–15.0)
MCH: 25.6 pg — ABNORMAL LOW (ref 26.0–34.0)
MCHC: 31.5 g/dL (ref 30.0–36.0)
MCV: 81.3 fL (ref 78.0–100.0)
Platelets: 268 10*3/uL (ref 150–400)
RBC: 3.9 MIL/uL (ref 3.87–5.11)
RDW: 15.3 % (ref 11.5–15.5)
WBC: 5.5 10*3/uL (ref 4.0–10.5)

## 2016-05-25 LAB — PROTEIN / CREATININE RATIO, URINE
Creatinine, Urine: 178 mg/dL
PROTEIN CREATININE RATIO: 0.21 mg/mg{creat} — AB (ref 0.00–0.15)
TOTAL PROTEIN, URINE: 38 mg/dL

## 2016-05-25 MED ORDER — SODIUM CHLORIDE 0.9% FLUSH: 3.0000 mL | Freq: Two times a day (BID) | INTRAVENOUS | Status: DC

## 2016-05-25 MED ORDER — OXYCODONE-ACETAMINOPHEN 5-325 MG PO TABS
1.0000 | ORAL_TABLET | ORAL | Status: DC | PRN
  Administered 2016-05-25 – 2016-05-27 (×2): 1 via ORAL
  Filled 2016-05-25 (×2): qty 1

## 2016-05-25 MED ORDER — MAGNESIUM SULFATE 50 % IJ SOLN
2.0000 g/h | INTRAVENOUS | Status: AC
  Administered 2016-05-25: 2 g/h via INTRAVENOUS
  Filled 2016-05-25 (×2): qty 80

## 2016-05-25 MED ORDER — LABETALOL HCL 5 MG/ML IV SOLN
20.0000 mg | INTRAVENOUS | Status: DC | PRN
  Administered 2016-05-26 (×2): 20 mg via INTRAVENOUS
  Filled 2016-05-25: qty 4
  Filled 2016-05-25: qty 8
  Filled 2016-05-25: qty 4

## 2016-05-25 MED ORDER — IBUPROFEN 600 MG PO TABS
600.0000 mg | ORAL_TABLET | Freq: Four times a day (QID) | ORAL | Status: DC | PRN
  Administered 2016-05-25 – 2016-05-27 (×2): 600 mg via ORAL
  Filled 2016-05-25 (×2): qty 1

## 2016-05-25 MED ORDER — HYDRALAZINE HCL 20 MG/ML IJ SOLN: 10.0000 mg | Freq: Once | INTRAMUSCULAR | Status: DC | PRN

## 2016-05-25 MED ORDER — SODIUM CHLORIDE 0.9 % IV SOLN: 250.0000 mL | INTRAVENOUS | Status: DC | PRN

## 2016-05-25 MED ORDER — MAGNESIUM SULFATE BOLUS VIA INFUSION
4.0000 g | Freq: Once | INTRAVENOUS | Status: AC
  Administered 2016-05-25: 4 g via INTRAVENOUS
  Filled 2016-05-25: qty 500

## 2016-05-25 MED ORDER — LACTATED RINGERS IV SOLN
INTRAVENOUS | Status: DC
  Administered 2016-05-25: 13:00:00 via INTRAVENOUS
  Administered 2016-05-26: 50 mL/h via INTRAVENOUS

## 2016-05-25 MED ORDER — SERTRALINE HCL 50 MG PO TABS
50.0000 mg | ORAL_TABLET | Freq: Every day | ORAL | Status: DC
  Administered 2016-05-25 – 2016-05-26 (×2): 50 mg via ORAL
  Filled 2016-05-25 (×3): qty 1

## 2016-05-25 MED ORDER — SODIUM CHLORIDE 0.9% FLUSH
3.0000 mL | INTRAVENOUS | Status: DC | PRN
  Administered 2016-05-26: 3 mL via INTRAVENOUS
  Filled 2016-05-25: qty 3

## 2016-05-25 NOTE — H&P (Signed)
Tiffany Solomon is a 30 y.o. female presenting for management of postpartum preeclampsia. She is PPD #11 s/p Cesarean section of twins. She presented to the office today complaining of headache for 3 days that is constant and would not resolve with ibuprofen or percocet. BP in the office was 170/104 repeat was 190/112. No ruq pain no dyspnea. No visual disturbances.    OB History    Gravida Para Term Preterm AB Living   4 2   2 1 3    SAB TAB Ectopic Multiple Live Births   1     1 3      Past Medical History:  Diagnosis Date  . Anemia   . Blood transfusion without reported diagnosis   . Depression   . Fibroid   . PVC's (premature ventricular contractions)    a. occ PVC seen on holter monitor 03/2016.  . S/P cesarean section 08/27/2014  . Sinus tachycardia   . Traumatic injury during pregnancy, antepartum    Past Surgical History:  Procedure Laterality Date  . CESAREAN SECTION N/A 08/27/2014   Procedure: CESAREAN SECTION;  Surgeon: Maeola Sarah. Landry Mellow, MD;  Location: [facility];  Service: Obstetrics;  Laterality: N/A;  . CESAREAN SECTION N/A 05/14/2016   Procedure: CESAREAN SECTION;  Surgeon: Christophe Louis, MD;  Location: [facility];  Service: Obstetrics;  Laterality: N/A;  . DILATION AND CURETTAGE OF UTERUS    . MYOMECTOMY    . ROOT CANAL     Family History: family history includes Brain cancer in her mother; Heart disease in her maternal aunt; Hypertension in her maternal aunt. Social History:  reports that she has never smoked. She has never used smokeless tobacco. She reports that she does not drink alcohol or use drugs.   Review of Systems  Constitutional: Negative.   Eyes: Negative.   Respiratory: Negative.   Cardiovascular: Negative.   Gastrointestinal: Negative.   Genitourinary: Negative.   Musculoskeletal: Negative.   Skin: Negative.   Neurological: Positive for headaches. Negative for dizziness, tingling, tremors, seizures and loss of consciousness.  Endo/Heme/Allergies:  Negative.   Psychiatric/Behavioral: Positive for depression.   History   Blood pressure (!) 157/87, pulse 79, temperature 98.4 F (36.9 C), temperature source Oral, resp. rate 20, height 5\' 4"  (1.626 m), weight 95.7 kg (211 lb), last menstrual period 08/19/2015, SpO2 96 %, unknown if currently breastfeeding. Exam Physical Exam  Vitals reviewed. Constitutional: She is oriented to person, place, and time. She appears well-developed and well-nourished.  HENT:  Head: Normocephalic and atraumatic.  Eyes: Conjunctivae are normal. Pupils are equal, round, and reactive to light.  Neck: Normal range of motion. Neck supple.  Cardiovascular: Normal rate, regular rhythm and normal heart sounds.   Respiratory: Effort normal and breath sounds normal.  GI: Soft. Bowel sounds are normal. She exhibits no distension.  Musculoskeletal: She exhibits edema.  Neurological: She is alert and oriented to person, place, and time. She has normal reflexes.  Skin: Skin is warm and dry.  Psychiatric: She has a normal mood and affect.      Assessment/Plan: Preeclampsia in the postpartum period. Based on hypertension and persistent headache.  Admit to [facility] hospital for pih labs Magnesium sulfate for seizure prophylaxis  Antihypertensive - labatelol and hydralazine per protocol  CCOB covering tonight after 7pm Dr. Simona Huh assuming care at 7 am 05/26/2016   Krislyn Donnan J. 05/25/2016, 5:29 PM

## 2016-05-25 NOTE — Progress Notes (Signed)
Spoke with Tim in Pharmacist, hospital and he will be brining up an abdominal binder.

## 2016-05-26 DIAGNOSIS — F418 Other specified anxiety disorders: Secondary | ICD-10-CM | POA: Diagnosis present

## 2016-05-26 DIAGNOSIS — F53 Puerperal psychosis: Secondary | ICD-10-CM | POA: Diagnosis present

## 2016-05-26 DIAGNOSIS — O99345 Other mental disorders complicating the puerperium: Secondary | ICD-10-CM | POA: Diagnosis present

## 2016-05-26 DIAGNOSIS — O1495 Unspecified pre-eclampsia, complicating the puerperium: Secondary | ICD-10-CM | POA: Diagnosis present

## 2016-05-26 MED ORDER — ACETAMINOPHEN 500 MG PO TABS
1000.0000 mg | ORAL_TABLET | Freq: Once | ORAL | Status: AC
  Administered 2016-05-26: 1000 mg via ORAL
  Filled 2016-05-26: qty 2

## 2016-05-26 MED ORDER — ONDANSETRON HCL 4 MG/2ML IJ SOLN
4.0000 mg | Freq: Four times a day (QID) | INTRAMUSCULAR | Status: DC
  Administered 2016-05-26 (×2): 4 mg via INTRAVENOUS
  Filled 2016-05-26 (×2): qty 2

## 2016-05-26 MED ORDER — ZOLPIDEM TARTRATE 5 MG PO TABS
5.0000 mg | ORAL_TABLET | Freq: Once | ORAL | Status: AC
  Administered 2016-05-26: 5 mg via ORAL
  Filled 2016-05-26: qty 1

## 2016-05-26 MED ORDER — POLYSACCHARIDE IRON COMPLEX 150 MG PO CAPS
150.0000 mg | ORAL_CAPSULE | Freq: Every day | ORAL | Status: DC
  Administered 2016-05-26: 150 mg via ORAL
  Filled 2016-05-26 (×2): qty 1

## 2016-05-26 MED ORDER — NIFEDIPINE ER OSMOTIC RELEASE 30 MG PO TB24
30.0000 mg | ORAL_TABLET | Freq: Every day | ORAL | Status: DC
  Administered 2016-05-26 – 2016-05-27 (×3): 30 mg via ORAL
  Filled 2016-05-26 (×2): qty 1

## 2016-05-26 MED ORDER — NIFEDIPINE ER OSMOTIC RELEASE 30 MG PO TB24
30.0000 mg | ORAL_TABLET | Freq: Once | ORAL | Status: AC
  Filled 2016-05-26: qty 1

## 2016-05-26 MED ORDER — ACETAMINOPHEN 160 MG/5ML PO SOLN
1000.0000 mg | Freq: Once | ORAL | Status: DC
  Filled 2016-05-26: qty 40.6

## 2016-05-26 MED ORDER — NIFEDIPINE ER OSMOTIC RELEASE 30 MG PO TB24: 30.0000 mg | ORAL_TABLET | Freq: Every day | ORAL | Status: DC

## 2016-05-26 NOTE — Progress Notes (Signed)
Called office twice, long hold time.  Now, paged Dr. Simona Huh, again.

## 2016-05-26 NOTE — Progress Notes (Signed)
  Paged Dr. Simona Huh regarding blood pressure, fluid orders and medication questions.

## 2016-05-26 NOTE — Progress Notes (Signed)
In to assess pt. Discussed diagnosis and anxiety at length. Discharge home when BP controlled on Procardia. Pt currently asymptomatic, exam normal. Continue to monitor.

## 2016-05-26 NOTE — Progress Notes (Signed)
Patient is nauseated, called office and paged for on call MD. Need orders

## 2016-05-26 NOTE — Progress Notes (Signed)
Subjective: Postpartum preeclampsia on Magnesium Sulfate.  Labetalol IV x 1 at ~ 0600 this am. Pt with C/o felling "blah"".  Decreased appetite.  Feels it is due to the magnesium.  No headache, visual changes, RUQ pain.   Breast feeding no.  Denies paine  Objective: Temp:  [97.9 F (36.6 C)-99.2 F (37.3 C)] 98.2 F (36.8 C) (10/26 0550) Pulse Rate:  [64-92] 92 (10/26 0647) Resp:  [16-20] 18 (10/26 0647) BP: (141-173)/(77-95) 150/85 (10/26 0647) SpO2:  [95 %-100 %] 100 % (10/26 0900) Weight:  [95.7 kg (211 lb)] 95.7 kg (211 lb) (10/26 0448)  Physical Exam: Gen: NAD Incision: clean, dry and intact, healing well DVT Evaluation: + Edema present, no calf tenderness bilaterally Neuro: Not easily illicited. Abdomen:  Soft, no RUQ pain.   Recent Labs  05/25/16 1211  HGB 10.0*  HCT 31.7*    Assessment/Plan: Postpartum preeclampsia on Magnesium sulfate.  UOP good Postpartum Hypertension.  BP moderate to severe range. S/p C-section of twins.  Continue Magnesium until 1300.  Start Procardia XL 30 mg once daily starting at 1300. Continue observation today.  If BP well controlled on Procardia, discharge home tomorrow. Dr. Landry Mellow to resume care at 7 am tomorrow am.     Thurnell Lose 05/26/2016, 9:35 AM

## 2016-05-27 MED ORDER — SERTRALINE HCL 50 MG PO TABS: 50.0000 mg | ORAL_TABLET | Freq: Every day | ORAL | 5 refills | Status: DC

## 2016-05-27 MED ORDER — NIFEDIPINE ER 60 MG PO TB24: ORAL_TABLET | ORAL | 1 refills | Status: DC

## 2016-05-27 NOTE — Progress Notes (Signed)
Subjective: Patient reports no problems voiding.  She denies headache.  No sob. Feels better.   Objective: I have reviewed patient's vital signs, intake and output, medications and labs.  General: alert and cooperative Resp: clear to auscultation bilaterally GI: soft, non-tender; bowel sounds normal; no masses,  no organomegaly Extremities: extremities normal, atraumatic, no cyanosis or edema   Assessment/Plan: HD #3 postpartum preeclampsia. Pt improved s/p Magnesium for 24 hours.  Diuresing well BP well controlled. Home with procardia XL 60 mg daily  Anxiety/ postpartum depression well controlled with zoloft 50 mg.  F/U in 1 week for BP check / hospital follow up   LOS: 2 days    Mak Bonny J. 05/27/2016, 7:30 AM

## 2016-05-27 NOTE — Progress Notes (Signed)
Patient given discharge instructions and verbalized understanding with teachback. No questions. Patient will stay for 0900 dose of procardia and then be discharged to home self care with friend. Pt is ambulatory.

## 2016-05-27 NOTE — Progress Notes (Signed)
Pt states she has emptied unmeasured urine collection hat x 2 tonight because she didn't want to call out for it to be emptied because she was going to the bathroom so frequently. RN and NT have emptied 3850cc of urine since 2300 during hourly rounding.

## 2016-05-27 NOTE — Discharge Summary (Signed)
Physician Discharge Summary  Patient ID: Tiffany Solomon MRN: [id number] DOB/AGE: 08-04-1985 30 y.o.  Admit date: 05/25/2016 Discharge date: 05/27/2016  Admission Diagnoses: preeclampsia in postpartum period   Discharge Diagnoses:  Active Problems:   Preeclampsia in postpartum period   Pre-eclampsia in postpartum period postpartum depression ./ anxiety   Discharged Condition: good  Hospital Course: pt was admitted on 05/25/2016 with preeclampsia in the postpartum period. She received magnesium sulfate for seizure prophylaxis x 24 hours. She received 1 dose of IV labetalol 20 mg.  She began diuresing well. Procardia XL 30 mg was started  For BP controll. She was started on zoloft 50 mg for postpartum depression / anxiety.  Consults: None  Significant Diagnostic Studies: labs: normal AST/ALT/ Creatinine Hgb 10.0   Treatments: magnesium sulfate. /   Discharge Exam: Blood pressure 137/84, pulse 87, temperature 98.8 F (37.1 C), temperature source Oral, resp. rate 18, height 5\' 4"  (1.626 m), weight 90.2 kg (198 lb 12 oz), last menstrual period 08/19/2015, SpO2 98 %, unknown if currently breastfeeding.   Disposition: 01-Home or Self Care  Discharge Instructions    Activity as tolerated - No restrictions    Complete by:  As directed    Call MD for:  difficulty breathing, headache or visual disturbances    Complete by:  As directed    Call MD for:  persistant nausea and vomiting    Complete by:  As directed    Call MD for:  redness, tenderness, or signs of infection (pain, swelling, redness, odor or green/yellow discharge around incision site)    Complete by:  As directed    Call MD for:  severe uncontrolled pain    Complete by:  As directed    Call MD for:  temperature >100.4    Complete by:  As directed    Diet - low sodium heart healthy    Complete by:  As directed    Lifting restrictions    Complete by:  As directed    Avoid lifting over 20 lbs   May shower / Bathe     Complete by:  As directed    May walk up steps    Complete by:  As directed    Sexual Activity Restrictions    Complete by:  As directed    Avoid sexual activity       Medication List    STOP taking these medications   ferrous sulfate 325 (65 FE) MG tablet     TAKE these medications   ibuprofen 600 MG tablet Commonly known as:  ADVIL,MOTRIN Take 1 tablet (600 mg total) by mouth every 6 (six) hours as needed for mild pain.   NIFEdipine 60 MG 24 hr tablet Commonly known as:  PROCARDIA-XL/ADALAT CC 1 po daily   oxyCODONE-acetaminophen 5-325 MG tablet Commonly known as:  PERCOCET/ROXICET Take 1-2 tablets by mouth every 4 (four) hours as needed for severe pain (pain scale > 7).   prenatal multivitamin Tabs tablet Take 1 tablet by mouth at bedtime.   sertraline 50 MG tablet Commonly known as:  ZOLOFT Take 1 tablet (50 mg total) by mouth daily.      Follow-up Information    Tiffany Ovens J., MD Follow up in 1 week(s).   Specialty:  Obstetrics and Gynecology Why:  patient already has a follow up appointment  Contact information: [address] [place] [address] [place] [address] [phone]           Signed: Catha Brow. 05/27/2016, 7:39  AM

## 2016-06-22 ENCOUNTER — Other Ambulatory Visit: Payer: Self-pay | Admitting: Obstetrics and Gynecology

## 2016-06-27 NOTE — Patient Instructions (Addendum)
Your procedure is scheduled on:  Friday, Dec. 8 2017  Enter through the [employer] [facility] at:  11:30 AM  Pick up the phone at the desk and dial [phone].  Call this number if you have problems the morning of surgery: [phone].  Remember: Do NOT eat food:  After Midnight Thursday, Dec. 7, 2017  Do NOT drink clear liquids after:  9:00 AM day of surgery  Take these medicines the morning of surgery with a SIP OF WATER:  Nifedipine  Stop ALL herbal medications and Ibuprofen products at this time   Do NOT wear jewelry (body piercing), metal hair clips/bobby pins, make-up, or nail polish. Do NOT wear lotions, powders, or perfumes.  You may wear deodorant. Do NOT shave for 48 hours prior to surgery. Do NOT bring valuables to the hospital. Contacts, dentures, or bridgework may not be worn into surgery.  Have a responsible adult drive you home and stay with you for 24 hours after your procedure

## 2016-06-28 ENCOUNTER — Encounter (HOSPITAL_COMMUNITY): Payer: Self-pay

## 2016-06-28 ENCOUNTER — Encounter (HOSPITAL_COMMUNITY)
Admission: RE | Admit: 2016-06-28 | Discharge: 2016-06-28 | Disposition: A | Discharge status: Home / Self Care | Payer: Medicaid Other | Source: Ambulatory Visit | Attending: Obstetrics and Gynecology | Admitting: Obstetrics and Gynecology

## 2016-06-28 DIAGNOSIS — Z01812 Encounter for preprocedural laboratory examination: Secondary | ICD-10-CM | POA: Insufficient documentation

## 2016-06-28 HISTORY — DX: Cellulitis and abscess of mouth: K12.2

## 2016-06-28 HISTORY — DX: Complete or unspecified spontaneous abortion without complication: O03.9

## 2016-06-28 HISTORY — DX: Personal history of other complications of pregnancy, childbirth and the puerperium: Z87.59

## 2016-06-28 HISTORY — DX: Headache, unspecified: R51.9

## 2016-06-28 HISTORY — DX: Anxiety disorder, unspecified: F41.9

## 2016-06-28 HISTORY — DX: Dyspnea, unspecified: R06.00

## 2016-06-28 HISTORY — DX: Headache: R51

## 2016-06-28 HISTORY — DX: Essential (primary) hypertension: I10

## 2016-06-28 LAB — BASIC METABOLIC PANEL
Anion gap: 6 (ref 5–15)
BUN: 6 mg/dL (ref 6–20)
CALCIUM: 9.5 mg/dL (ref 8.9–10.3)
CO2: 31 mmol/L (ref 22–32)
CREATININE: 0.69 mg/dL (ref 0.44–1.00)
Chloride: 104 mmol/L (ref 101–111)
GFR calc non Af Amer: >60 mL/min (ref >60)
GLUCOSE: 92 mg/dL (ref 65–99)
Potassium: 3.9 mmol/L (ref 3.5–5.1)
Sodium: 141 mmol/L (ref 135–145)

## 2016-06-28 LAB — CBC
HEMATOCRIT: 34.1 % — AB (ref 36.0–46.0)
Hemoglobin: 10.5 g/dL — ABNORMAL LOW (ref 12.0–15.0)
MCH: 25.4 pg — ABNORMAL LOW (ref 26.0–34.0)
MCHC: 30.8 g/dL (ref 30.0–36.0)
MCV: 82.6 fL (ref 78.0–100.0)
Platelets: 248 10*3/uL (ref 150–400)
RBC: 4.13 MIL/uL (ref 3.87–5.11)
RDW: 16.4 % — AB (ref 11.5–15.5)
WBC: 6.2 10*3/uL (ref 4.0–10.5)

## 2016-07-08 ENCOUNTER — Ambulatory Visit (HOSPITAL_COMMUNITY): Payer: Medicaid Other | Admitting: Registered Nurse

## 2016-07-08 ENCOUNTER — Encounter (HOSPITAL_COMMUNITY)
Admission: RE | Disposition: A | Discharge status: Home / Self Care | Payer: Self-pay | Source: Ambulatory Visit | Attending: Obstetrics and Gynecology

## 2016-07-08 ENCOUNTER — Encounter (HOSPITAL_COMMUNITY): Payer: Self-pay

## 2016-07-08 ENCOUNTER — Ambulatory Visit (HOSPITAL_COMMUNITY)
Admission: RE | Admit: 2016-07-08 | Discharge: 2016-07-08 | Disposition: A | Discharge status: Home / Self Care | Payer: Medicaid Other | Source: Ambulatory Visit | Attending: Obstetrics and Gynecology | Admitting: Obstetrics and Gynecology

## 2016-07-08 DIAGNOSIS — Z302 Encounter for sterilization: Secondary | ICD-10-CM | POA: Diagnosis not present

## 2016-07-08 DIAGNOSIS — I10 Essential (primary) hypertension: Secondary | ICD-10-CM | POA: Insufficient documentation

## 2016-07-08 DIAGNOSIS — F329 Major depressive disorder, single episode, unspecified: Secondary | ICD-10-CM | POA: Insufficient documentation

## 2016-07-08 DIAGNOSIS — Z79899 Other long term (current) drug therapy: Secondary | ICD-10-CM | POA: Diagnosis not present

## 2016-07-08 HISTORY — PX: LAPAROSCOPIC TUBAL LIGATION: SHX1937

## 2016-07-08 LAB — PREGNANCY, URINE: Preg Test, Ur: NEGATIVE

## 2016-07-08 SURGERY — LIGATION, FALLOPIAN TUBE, LAPAROSCOPIC
Anesthesia: General | Site: Abdomen | Laterality: Bilateral

## 2016-07-08 MED ORDER — KETOROLAC TROMETHAMINE 30 MG/ML IJ SOLN
INTRAMUSCULAR | Status: DC | PRN
Start: 2016-07-08 — End: 2016-07-08
  Administered 2016-07-08: 30 mg via INTRAVENOUS

## 2016-07-08 MED ORDER — DEXAMETHASONE SODIUM PHOSPHATE 10 MG/ML IJ SOLN
INTRAMUSCULAR | Status: AC
  Filled 2016-07-08: qty 1

## 2016-07-08 MED ORDER — FENTANYL CITRATE (PF) 250 MCG/5ML IJ SOLN
INTRAMUSCULAR | Status: AC
  Filled 2016-07-08: qty 5

## 2016-07-08 MED ORDER — LIDOCAINE HCL (CARDIAC) 20 MG/ML IV SOLN
INTRAVENOUS | Status: AC
  Filled 2016-07-08: qty 5

## 2016-07-08 MED ORDER — OXYCODONE-ACETAMINOPHEN 5-325 MG PO TABS: 1.0000 | ORAL_TABLET | ORAL | 0 refills | Status: DC | PRN

## 2016-07-08 MED ORDER — ROCURONIUM BROMIDE 10 MG/ML (PF) SYRINGE
PREFILLED_SYRINGE | INTRAVENOUS | Status: DC | PRN
  Administered 2016-07-08: 30 mg via INTRAVENOUS

## 2016-07-08 MED ORDER — OXYCODONE HCL 5 MG/5ML PO SOLN: 5.0000 mg | Freq: Once | ORAL | Status: DC | PRN

## 2016-07-08 MED ORDER — ACETAMINOPHEN 325 MG PO TABS: 325.0000 mg | ORAL_TABLET | ORAL | Status: DC | PRN

## 2016-07-08 MED ORDER — LACTATED RINGERS IV SOLN
INTRAVENOUS | Status: DC
  Administered 2016-07-08 (×2): via INTRAVENOUS

## 2016-07-08 MED ORDER — ROCURONIUM BROMIDE 100 MG/10ML IV SOLN
INTRAVENOUS | Status: AC
  Filled 2016-07-08: qty 1

## 2016-07-08 MED ORDER — OXYCODONE HCL 5 MG PO TABS: 5.0000 mg | ORAL_TABLET | Freq: Once | ORAL | Status: DC | PRN

## 2016-07-08 MED ORDER — ONDANSETRON HCL 4 MG/2ML IJ SOLN: 4.0000 mg | Freq: Once | INTRAMUSCULAR | Status: DC | PRN

## 2016-07-08 MED ORDER — IBUPROFEN 800 MG PO TABS: ORAL_TABLET | ORAL | 1 refills | Status: DC

## 2016-07-08 MED ORDER — ONDANSETRON HCL 4 MG/2ML IJ SOLN
INTRAMUSCULAR | Status: AC
  Filled 2016-07-08: qty 2

## 2016-07-08 MED ORDER — PROPOFOL 10 MG/ML IV BOLUS
INTRAVENOUS | Status: AC
  Filled 2016-07-08: qty 20

## 2016-07-08 MED ORDER — SUGAMMADEX SODIUM 200 MG/2ML IV SOLN
INTRAVENOUS | Status: DC | PRN
Start: 2016-07-08 — End: 2016-07-08
  Administered 2016-07-08: 200 mg via INTRAVENOUS

## 2016-07-08 MED ORDER — SUGAMMADEX SODIUM 200 MG/2ML IV SOLN
INTRAVENOUS | Status: AC
  Filled 2016-07-08: qty 2

## 2016-07-08 MED ORDER — SUCCINYLCHOLINE CHLORIDE 200 MG/10ML IV SOSY
PREFILLED_SYRINGE | INTRAVENOUS | Status: DC | PRN
  Administered 2016-07-08: 100 mg via INTRAVENOUS

## 2016-07-08 MED ORDER — DEXAMETHASONE SODIUM PHOSPHATE 10 MG/ML IJ SOLN
INTRAMUSCULAR | Status: DC | PRN
  Administered 2016-07-08: 10 mg via INTRAVENOUS

## 2016-07-08 MED ORDER — KETOROLAC TROMETHAMINE 30 MG/ML IJ SOLN: 30.0000 mg | Freq: Once | INTRAMUSCULAR | Status: DC

## 2016-07-08 MED ORDER — ACETAMINOPHEN 160 MG/5ML PO SOLN: 325.0000 mg | ORAL | Status: DC | PRN

## 2016-07-08 MED ORDER — PROPOFOL 10 MG/ML IV BOLUS
INTRAVENOUS | Status: DC | PRN
  Administered 2016-07-08: 200 mg via INTRAVENOUS

## 2016-07-08 MED ORDER — SUCCINYLCHOLINE CHLORIDE 200 MG/10ML IV SOSY
PREFILLED_SYRINGE | INTRAVENOUS | Status: AC
  Filled 2016-07-08: qty 10

## 2016-07-08 MED ORDER — MIDAZOLAM HCL 2 MG/2ML IJ SOLN
INTRAMUSCULAR | Status: AC
  Filled 2016-07-08: qty 2

## 2016-07-08 MED ORDER — SCOPOLAMINE 1 MG/3DAYS TD PT72
1.0000 | MEDICATED_PATCH | Freq: Once | TRANSDERMAL | Status: DC
  Administered 2016-07-08: 1.5 mg via TRANSDERMAL

## 2016-07-08 MED ORDER — MEPERIDINE HCL 25 MG/ML IJ SOLN: 6.2500 mg | INTRAMUSCULAR | Status: DC | PRN

## 2016-07-08 MED ORDER — BUPIVACAINE HCL (PF) 0.25 % IJ SOLN
INTRAMUSCULAR | Status: DC | PRN
  Administered 2016-07-08: 30 mL

## 2016-07-08 MED ORDER — FENTANYL CITRATE (PF) 100 MCG/2ML IJ SOLN: 25.0000 ug | INTRAMUSCULAR | Status: DC | PRN

## 2016-07-08 MED ORDER — MIDAZOLAM HCL 5 MG/5ML IJ SOLN
INTRAMUSCULAR | Status: DC | PRN
  Administered 2016-07-08: 2 mg via INTRAVENOUS

## 2016-07-08 MED ORDER — KETOROLAC TROMETHAMINE 30 MG/ML IJ SOLN
INTRAMUSCULAR | Status: AC
  Filled 2016-07-08: qty 1

## 2016-07-08 MED ORDER — LIDOCAINE 2% (20 MG/ML) 5 ML SYRINGE
INTRAMUSCULAR | Status: DC | PRN
Start: 2016-07-08 — End: 2016-07-08
  Administered 2016-07-08: 100 mg via INTRAVENOUS

## 2016-07-08 MED ORDER — BUPIVACAINE HCL (PF) 0.25 % IJ SOLN
INTRAMUSCULAR | Status: AC
  Filled 2016-07-08: qty 30

## 2016-07-08 MED ORDER — ONDANSETRON HCL 4 MG/2ML IJ SOLN
INTRAMUSCULAR | Status: DC | PRN
  Administered 2016-07-08: 4 mg via INTRAVENOUS

## 2016-07-08 MED ORDER — SCOPOLAMINE 1 MG/3DAYS TD PT72
MEDICATED_PATCH | TRANSDERMAL | Status: AC
  Administered 2016-07-08: 1.5 mg via TRANSDERMAL
  Filled 2016-07-08: qty 1

## 2016-07-08 MED ORDER — FENTANYL CITRATE (PF) 100 MCG/2ML IJ SOLN
INTRAMUSCULAR | Status: DC | PRN
  Administered 2016-07-08 (×2): 50 ug via INTRAVENOUS
  Administered 2016-07-08: 150 ug via INTRAVENOUS

## 2016-07-08 SURGICAL SUPPLY — 22 items
CATH ROBINSON RED A/P 16FR (CATHETERS) ×3 IMPLANT
CLOTH BEACON ORANGE TIMEOUT ST (SAFETY) ×3 IMPLANT
DERMABOND ADVANCED (GAUZE/BANDAGES/DRESSINGS) ×2
DERMABOND ADVANCED .7 DNX12 (GAUZE/BANDAGES/DRESSINGS) ×1 IMPLANT
DILATOR CANAL MILEX (MISCELLANEOUS) IMPLANT
DRSG OPSITE POSTOP 3X4 (GAUZE/BANDAGES/DRESSINGS) ×3 IMPLANT
DURAPREP 26ML APPLICATOR (WOUND CARE) ×3 IMPLANT
GLOVE BIOGEL M 6.5 STRL (GLOVE) ×6 IMPLANT
GLOVE BIOGEL PI IND STRL 6.5 (GLOVE) ×1 IMPLANT
GLOVE BIOGEL PI IND STRL 7.0 (GLOVE) ×1 IMPLANT
GLOVE BIOGEL PI INDICATOR 6.5 (GLOVE) ×2
GLOVE BIOGEL PI INDICATOR 7.0 (GLOVE) ×2
GOWN STRL REUS W/TWL LRG LVL3 (GOWN DISPOSABLE) ×6 IMPLANT
PACK LAPAROSCOPY BASIN (CUSTOM PROCEDURE TRAY) ×3 IMPLANT
SLEEVE XCEL OPT CAN 5 100 (ENDOMECHANICALS) ×3 IMPLANT
SOLUTION ELECTROLUBE (MISCELLANEOUS) ×3 IMPLANT
SUT VICRYL 0 UR6 27IN ABS (SUTURE) ×3 IMPLANT
SUT VICRYL 4-0 PS2 18IN ABS (SUTURE) ×3 IMPLANT
TOWEL OR 17X24 6PK STRL BLUE (TOWEL DISPOSABLE) ×6 IMPLANT
TROCAR XCEL NON-BLD 11X100MML (ENDOMECHANICALS) ×3 IMPLANT
WARMER LAPAROSCOPE (MISCELLANEOUS) ×3 IMPLANT
WATER STERILE IRR 1000ML POUR (IV SOLUTION) ×3 IMPLANT

## 2016-07-08 NOTE — Op Note (Signed)
07/08/2016  3:15 PM  PATIENT:  Tiffany Solomon  30 y.o. female who desires permanent sterilization.   Pre operative Diagnosis. Patient desires permanent sterilization.   POST-OPERATIVE DIAGNOSIS:  sterilization  PROCEDURE:  Procedure(s): LAPAROSCOPIC TUBAL LIGATION (Bilateral)  SURGEON:  Surgeon(s) and Role:    * Christophe Louis, MD - Primary  PHYSICIAN ASSISTANT:   ASSISTANTS: none   ANESTHESIA:   general  EBL:  Total I/O In: 1000 [I.V.:1000] Out: 210 [Urine:200; Blood:10]  BLOOD ADMINISTERED:none  DRAINS: none   LOCAL MEDICATIONS USED:  MARCAINE     SPECIMEN:  No Specimen  DISPOSITION OF SPECIMEN:  N/A  COUNTS:  YES  TOURNIQUET:  * No tourniquets in log *  DICTATION: .Dragon Dictation  PLAN OF CARE: Discharge to home after PACU  PATIENT DISPOSITION:  PACU - hemodynamically stable.   Delay start of Pharmacological VTE agent (>24hrs) due to surgical blood loss or risk of bleeding: not applicable  Procedure: The patient was taken to the operating room where she was placed under general anesthesia. She is placed in the dorsolithotomy position. She was prepped and draped in the usual sterile fashion. Time out was performed.  Speculum was placed into the vaginal vault. In the anterior lip of the cervix grasped with a single-tooth tenaculum. A uterine manipulator was placed without difficulty. The single-tooth tenaculum was  Removed. The speculum was removed.  Attention was turned to the umbilicus which was injected with 10 cc of quarter percent Marcaine. A 11 mm incision was made with the scalpel. A 11 mm trocar was placed under direct visualization. Entry into the peritoneum was visualized. The pneumoperitoneum was achieved with CO2 gas.  Her  fallopian tubes and ovaries appeared normal.  Operative scope was inserted and the right fallopian tube was identified and followed out to the fimbriated end. The a 2-3 cm portion of the right fallopian tube was cauterized with the  Kleppinger.  The segment was then cut with laparoscopic scissors. This was repeated on the left fallopian tube  A laparoscopic needle was inserted and 10 cc of quarter percent Marcaine was placed along the ampullary portion of the tubes  bilaterally. Pneumoperitoneum was released. The umbilical trocar was removed under direct visualization. The fascia was closed with 0 Vicryl. The skin was closed with 4-0 Vicryl. The derma bond placed a over the skin incision. The uterine manipulator was removed.   Patient was awakened from anesthesia taken to the recovery room awake and in stable condition.

## 2016-07-08 NOTE — Anesthesia Postprocedure Evaluation (Signed)
Anesthesia Post Note  Patient: Tiffany Solomon  Procedure(s) Performed: Procedure(s) (LRB): LAPAROSCOPIC TUBAL LIGATION (Bilateral)  Patient location during evaluation: PACU Anesthesia Type: General Level of consciousness: awake Pain management: pain level controlled Vital Signs Assessment: post-procedure vital signs reviewed and stable Respiratory status: spontaneous breathing Cardiovascular status: stable Postop Assessment: no signs of nausea or vomiting Anesthetic complications: no     Last Vitals:  Vitals:   07/08/16 1600 07/08/16 1615  BP: 129/79 127/71  Pulse: 87 84  Resp: 17 12  Temp:  36.5 C    Last Pain:  Vitals:   07/08/16 1615  TempSrc:   PainSc: 1    Pain Goal: Patients Stated Pain Goal: 3 (07/08/16 1545)               [place]

## 2016-07-08 NOTE — H&P (Signed)
Tiffany Solomon is an 30 y.o. female. [name] presents for tubal ligation. She desires permanent sterilization.   Pertinent Gynecological History:  no    Past Medical History:  Diagnosis Date  . Anemia   . Anxiety   . Blood transfusion without reported diagnosis   . Depression   . Dyspnea    with anxiety during pregnancy, no current issues  . Fibroid   . Headache    Migraines  . History of placenta abruption   . Hypertension    postpartum pre eclampsia, was re-hospitalize  . Miscarriage   . Oral abscess   . PVC's (premature ventricular contractions)    a. occ PVC seen on holter monitor 03/2016. During pregnancy  . S/P cesarean section 08/27/2014  . Sinus tachycardia    during pregnancy  . Traumatic injury during pregnancy, antepartum     Past Surgical History:  Procedure Laterality Date  . CESAREAN SECTION N/A 08/27/2014   Procedure: CESAREAN SECTION;  Surgeon: Tiffany Sarah. Landry Mellow, MD;  Location: [facility];  Service: Obstetrics;  Laterality: N/A;  . CESAREAN SECTION N/A 05/14/2016   Procedure: CESAREAN SECTION;  Surgeon: Tiffany Louis, MD;  Location: [facility];  Service: Obstetrics;  Laterality: N/A;  . DILATION AND CURETTAGE OF UTERUS    . MYOMECTOMY    . ROOT CANAL    . WISDOM TOOTH EXTRACTION      Family History  Problem Relation Age of Onset  . Brain cancer Mother   . Heart disease Maternal Aunt   . Hypertension Maternal Aunt     Social History:  reports that she has never smoked. She has never used smokeless tobacco. She reports that she does not drink alcohol or use drugs.  Allergies: No Known Allergies  Prescriptions Prior to Admission  Medication Sig Dispense Refill Last Dose  . acetaminophen (TYLENOL) 500 MG tablet Take 1,000 mg by mouth every 6 (six) hours as needed for mild pain or headache.   Past Month at Unknown time  . amoxicillin (AMOXIL) 500 MG capsule Take 500 mg by mouth 4 (four) times daily. Pt is having a root canal done next month, December.. She  is taking the amoxicillin twice a day, buy when I called the pharmacy, they said the prescription was for 4 times a day.   07/07/2016 at 2100  . NIFEdipine (PROCARDIA-XL/ADALAT CC) 60 MG 24 hr tablet 1 po daily 30 tablet 1 07/08/2016 at 0700  . oxyCODONE-acetaminophen (PERCOCET/ROXICET) 5-325 MG tablet Take 1-2 tablets by mouth every 4 (four) hours as needed for severe pain (pain scale > 7). 30 tablet 0 Past Month at Unknown time  . sertraline (ZOLOFT) 50 MG tablet Take 1 tablet (50 mg total) by mouth daily. 30 tablet 5 07/07/2016 at 2100  . ibuprofen (ADVIL,MOTRIN) 600 MG tablet Take 1 tablet (600 mg total) by mouth every 6 (six) hours as needed for mild pain. 30 tablet 1 05/24/2016 at Unknown time    Review of Systems  Constitutional: Negative.   HENT: Negative.   Eyes: Negative.   Respiratory: Negative.   Cardiovascular: Negative.   Gastrointestinal: Negative.   Genitourinary: Negative.   Musculoskeletal: Negative.   Skin: Negative.   Neurological: Negative.   Endo/Heme/Allergies: Negative.   Psychiatric/Behavioral: Negative.     Blood pressure 114/80, pulse 82, temperature 97.9 F (36.6 C), temperature source Oral, resp. rate 16, last menstrual period 06/16/2016, SpO2 100 %, not currently breastfeeding. Physical Exam  Constitutional: She is oriented to person, place, and time. She  appears well-developed and well-nourished.  HENT:  Head: Normocephalic and atraumatic.  Eyes: Conjunctivae are normal. Pupils are equal, round, and reactive to light.  Cardiovascular: Normal rate and regular rhythm.   Respiratory: Effort normal and breath sounds normal.  GI: Soft. Bowel sounds are normal.  Genitourinary: Vagina normal and uterus normal.  Musculoskeletal: Normal range of motion. She exhibits no edema.  Neurological: She is alert and oriented to person, place, and time. She has normal reflexes.  Skin: Skin is warm and dry.  Psychiatric: She has a normal mood and affect.    Results for  orders placed or performed during the hospital encounter of 07/08/16 (from the past 24 hour(s))  Pregnancy, urine     Status: None   Collection Time: 07/08/16 11:30 AM  Result Value Ref Range   Preg Test, Ur NEGATIVE NEGATIVE    No results found.  Assessment/Plan:  pt desires permanent sterilization via laparoscopic bilateral tubal ligation. R/b/a of surgery discussed with the patient including but not limited to infection/ bleeding damage to bowel and surround organs with the need for further surgery. R/o of adhesive disease  preventing tubal ligation discussed given h/o myomectomy and cesarean x 2. R/o ectopic pregnancy discussed. Pt voiced understanding and desires to proceed.   Tiffany Solomon J. 07/08/2016, 12:55 PM

## 2016-07-08 NOTE — Anesthesia Preprocedure Evaluation (Signed)
Anesthesia Evaluation  Patient identified by MRN, date of birth, ID band Patient awake    Reviewed: Allergy & Precautions, H&P , NPO status , Patient's Chart, lab work & pertinent test results  Airway Mallampati: I  TM Distance: >3 FB Neck ROM: full    Dental no notable dental hx. (+) Teeth Intact   Pulmonary    Pulmonary exam normal        Cardiovascular hypertension, Normal cardiovascular exam     Neuro/Psych negative psych ROS   GI/Hepatic negative GI ROS, Neg liver ROS,   Endo/Other  negative endocrine ROS  Renal/GU negative Renal ROS     Musculoskeletal   Abdominal (+) + obese,   Peds  Hematology   Anesthesia Other Findings   Reproductive/Obstetrics negative OB ROS                             Anesthesia Physical Anesthesia Plan  ASA: II  Anesthesia Plan: General   Post-op Pain Management:    Induction: Intravenous  Airway Management Planned: Oral ETT  Additional Equipment:   Intra-op Plan:   Post-operative Plan: Extubation in OR  Informed Consent: I have reviewed the patients History and Physical, chart, labs and discussed the procedure including the risks, benefits and alternatives for the proposed anesthesia with the patient or authorized representative who has indicated his/her understanding and acceptance.   Dental Advisory Given  Plan Discussed with: CRNA and Surgeon  Anesthesia Plan Comments:         Anesthesia Quick Evaluation

## 2016-07-08 NOTE — Anesthesia Procedure Notes (Signed)
Procedure Name: Intubation Date/Time: 07/08/2016 1:27 PM Performed by: Talbot Grumbling Pre-anesthesia Checklist: Patient identified, Emergency Drugs available, Suction available and Patient being monitored Patient Re-evaluated:Patient Re-evaluated prior to inductionOxygen Delivery Method: Circle system utilized Preoxygenation: Pre-oxygenation with 100% oxygen Intubation Type: IV induction Ventilation: Mask ventilation without difficulty Laryngoscope Size: Mac and 3 Grade View: Grade I Tube type: Oral Tube size: 7.0 mm Number of attempts: 1 Airway Equipment and Method: Stylet Placement Confirmation: ETT inserted through vocal cords under direct vision,  positive ETCO2 and breath sounds checked- equal and bilateral Secured at: 21 cm Tube secured with: Tape Dental Injury: Teeth and Oropharynx as per pre-operative assessment

## 2016-07-08 NOTE — Discharge Instructions (Signed)

## 2016-07-08 NOTE — Transfer of Care (Signed)
Immediate Anesthesia Transfer of Care Note  Patient: Tiffany Solomon  Procedure(s) Performed: Procedure(s): LAPAROSCOPIC TUBAL LIGATION (Bilateral)  Patient Location: PACU  Anesthesia Type:General  Level of Consciousness:  sedated, patient cooperative and responds to stimulation  Airway & Oxygen Therapy:Patient Spontanous Breathing and Patient connected to face mask oxgen  Post-op Assessment:  Report given to PACU RN and Post -op Vital signs reviewed and stable  Post vital signs:  Reviewed and stable  Last Vitals:  Vitals:   07/08/16 1200  BP: 114/80  Pulse: 82  Resp: 16  Temp: 123XX123 C    Complications: No apparent anesthesia complications

## 2016-07-09 ENCOUNTER — Encounter (HOSPITAL_COMMUNITY): Payer: Self-pay | Admitting: Obstetrics and Gynecology

## 2016-08-24 ENCOUNTER — Emergency Department (HOSPITAL_COMMUNITY)
Admission: EM | Admit: 2016-08-24 | Discharge: 2016-08-24 | Disposition: A | Discharge status: Home / Self Care | Payer: Medicaid Other | Attending: Emergency Medicine | Admitting: Emergency Medicine

## 2016-08-24 ENCOUNTER — Encounter (HOSPITAL_COMMUNITY): Payer: Self-pay | Admitting: Family Medicine

## 2016-08-24 DIAGNOSIS — I10 Essential (primary) hypertension: Secondary | ICD-10-CM | POA: Diagnosis not present

## 2016-08-24 DIAGNOSIS — J029 Acute pharyngitis, unspecified: Secondary | ICD-10-CM | POA: Insufficient documentation

## 2016-08-24 LAB — RAPID STREP SCREEN (MED CTR MEBANE ONLY): Streptococcus, Group A Screen (Direct): NEGATIVE

## 2016-08-24 MED ORDER — DEXAMETHASONE 2 MG PO TABS
10.0000 mg | ORAL_TABLET | Freq: Once | ORAL | Status: AC
  Administered 2016-08-24: 10 mg via ORAL
  Filled 2016-08-24: qty 2

## 2016-08-24 MED ORDER — HYDROCODONE-ACETAMINOPHEN 7.5-325 MG/15ML PO SOLN: 10.0000 mL | Freq: Four times a day (QID) | ORAL | 0 refills | Status: DC | PRN

## 2016-08-24 NOTE — ED Triage Notes (Signed)
Patient is complaining of sore throat and dry cough. Denies congestion, fever. Children diagnosed with RSV last Friday. Patient's symptoms started last Wednesday. Took OTC cough drops and chloraseptic. PT has been using ALBUTEROL inhaler and TESSALON PEARLS.

## 2016-08-24 NOTE — ED Provider Notes (Signed)
[facility] DEPT Provider Note   CSN: [id number] Arrival date & time: 08/24/16  0149     History   Chief Complaint Chief Complaint  Patient presents with  . Sore Throat    HPI Tiffany Solomon is a 31 y.o. female.  Patient presents with severe sore throat for the past week. No fever. Pain continues to progress. No difficulty swallowing. No facial swelling. She has a dry cough but no congestion, sneezing or otalgia. No nausea or vomiting. No rash.   The history is provided by the patient. No language interpreter was used.  Sore Throat  Pertinent negatives include no chest pain, no abdominal pain and no shortness of breath.    Past Medical History:  Diagnosis Date  . Anemia   . Anxiety   . Blood transfusion without reported diagnosis   . Depression   . Dyspnea    with anxiety during pregnancy, no current issues  . Fibroid   . Headache    Migraines  . History of placenta abruption   . Hypertension    postpartum pre eclampsia, was re-hospitalize  . Miscarriage   . Oral abscess   . PVC's (premature ventricular contractions)    a. occ PVC seen on holter monitor 03/2016. During pregnancy  . S/P cesarean section 08/27/2014  . Sinus tachycardia    during pregnancy  . Traumatic injury during pregnancy, antepartum     Patient Active Problem List   Diagnosis Date Noted  . Preeclampsia in postpartum period 05/25/2016  . Pre-eclampsia in postpartum period 05/25/2016  . S/P cesarean section 05/15/2016  . Placental abruption 05/15/2016    Past Surgical History:  Procedure Laterality Date  . CESAREAN SECTION N/A 08/27/2014   Procedure: CESAREAN SECTION;  Surgeon: Maeola Sarah. Landry Mellow, MD;  Location: [facility];  Service: Obstetrics;  Laterality: N/A;  . CESAREAN SECTION N/A 05/14/2016   Procedure: CESAREAN SECTION;  Surgeon: Christophe Louis, MD;  Location: [facility];  Service: Obstetrics;  Laterality: N/A;  . DILATION AND CURETTAGE OF UTERUS    . LAPAROSCOPIC TUBAL LIGATION  Bilateral 07/08/2016   Procedure: LAPAROSCOPIC TUBAL LIGATION;  Surgeon: Christophe Louis, MD;  Location: [facility];  Service: Gynecology;  Laterality: Bilateral;  . MYOMECTOMY    . ROOT CANAL    . WISDOM TOOTH EXTRACTION      OB History    Gravida Para Term Preterm AB Living   4 2   2 1 3    SAB TAB Ectopic Multiple Live Births   1     1 3        Home Medications    Prior to Admission medications   Medication Sig Start Date End Date Taking? Authorizing Provider  acetaminophen (TYLENOL) 500 MG tablet Take 1,000 mg by mouth every 6 (six) hours as needed for mild pain or headache.    Historical Provider, MD  amoxicillin (AMOXIL) 500 MG capsule Take 500 mg by mouth 4 (four) times daily. Pt is having a root canal done next month, December.. She is taking the amoxicillin twice a day, buy when I called the pharmacy, they said the prescription was for 4 times a day.    Historical Provider, MD  ibuprofen (ADVIL,MOTRIN) 600 MG tablet Take 1 tablet (600 mg total) by mouth every 6 (six) hours as needed for mild pain. 05/17/16   Christophe Louis, MD  ibuprofen (ADVIL,MOTRIN) 800 MG tablet 1 po every 8 hours as needed for pain 07/08/16   Christophe Louis, MD  NIFEdipine (PROCARDIA-XL/ADALAT CC)  60 MG 24 hr tablet 1 po daily 05/27/16   Christophe Louis, MD  oxyCODONE-acetaminophen (ROXICET) 5-325 MG tablet Take 1-2 tablets by mouth every 4 (four) hours as needed for severe pain. 07/08/16   Christophe Louis, MD  sertraline (ZOLOFT) 50 MG tablet Take 1 tablet (50 mg total) by mouth daily. 05/27/16   Christophe Louis, MD    Family History Family History  Problem Relation Age of Onset  . Brain cancer Mother   . Heart disease Maternal Aunt   . Hypertension Maternal Aunt     Social History Social History  Substance Use Topics  . Smoking status: Never Smoker  . Smokeless tobacco: Never Used  . Alcohol use No     Allergies   Patient has no known allergies.   Review of Systems Review of Systems  Constitutional: Negative for chills and  fever.  HENT: Positive for sore throat. Negative for congestion, rhinorrhea, sneezing and trouble swallowing.   Eyes: Negative for itching.  Respiratory: Positive for cough. Negative for shortness of breath.   Cardiovascular: Negative.  Negative for chest pain.  Gastrointestinal: Negative.  Negative for abdominal pain, nausea and vomiting.  Musculoskeletal: Negative.  Negative for myalgias.  Skin: Negative.   Neurological: Negative.      Physical Exam Updated Vital Signs BP 137/93 (BP Location: Left Arm)   Pulse 86   Temp 98.3 F (36.8 C) (Oral)   Resp 18   Ht 5\' 4"  (1.626 m)   Wt 88.5 kg   LMP 08/19/2015   SpO2 100%   BMI 33.47 kg/m   Physical Exam  Constitutional: She appears well-developed and well-nourished.  HENT:  Head: Normocephalic.  Oropharynx is erythematous without significant swelling or exudates.  Neck: Normal range of motion. Neck supple.  Cardiovascular: Normal rate and regular rhythm.   No murmur heard. Pulmonary/Chest: Effort normal and breath sounds normal. She has no wheezes. She has no rales.  Abdominal: Soft. Bowel sounds are normal. There is no tenderness. There is no rebound and no guarding.  Musculoskeletal: Normal range of motion.  Neurological: She is alert.  Skin: Skin is warm and dry. No rash noted.  Psychiatric: She has a normal mood and affect.     ED Treatments / Results  Labs (all labs ordered are listed, but only abnormal results are displayed) Labs Reviewed  RAPID STREP SCREEN (NOT AT [facility])  CULTURE, GROUP A STREP [facility])   Results for orders placed or performed during the hospital encounter of 08/24/16  Rapid strep screen  Result Value Ref Range   Streptococcus, Group A Screen (Direct) NEGATIVE NEGATIVE  Culture, group A strep  Result Value Ref Range   Specimen Description THROAT    Special Requests NONE Reflexed from [id number]    Culture      NO GROUP A STREP (S.PYOGENES) ISOLATED Performed at [facility], [place]  [address] [place], [place] [address]    Report Status 08/26/2016 FINAL      EKG  EKG Interpretation None       Radiology No results found.  Procedures Procedures (including critical care time)  Medications Ordered in ED Medications  dexamethasone (DECADRON) tablet 10 mg (not administered)     Initial Impression / Assessment and Plan / ED Course  I have reviewed the triage vital signs and the nursing notes.  Pertinent labs & imaging results that were available during my care of the patient were reviewed by me and considered in my medical decision making (see chart  for details).     Patient presents with symptoms of cough and sore throat. No fever. Strep negative. Lungs clear, VSS, afebrile. Likely viral ST.  Final Clinical Impressions(s) / ED Diagnoses   Final diagnoses:  None   1. Pharyngitis  New Prescriptions New Prescriptions   No medications on file     Charlann Lange, Hershal Coria 08/26/16 2057    Merryl Hacker, MD 08/27/16 [phone]

## 2016-08-24 NOTE — ED Notes (Signed)
Bed: WA09 Expected date:  Expected time:  Means of arrival:  Comments: Need time to catch up

## 2016-08-26 LAB — CULTURE, GROUP A STREP (THRC)

## 2016-09-01 ENCOUNTER — Encounter (HOSPITAL_COMMUNITY): Payer: Self-pay | Admitting: Emergency Medicine

## 2016-09-01 ENCOUNTER — Emergency Department (HOSPITAL_COMMUNITY)
Admission: EM | Admit: 2016-09-01 | Discharge: 2016-09-01 | Disposition: A | Discharge status: Home / Self Care | Payer: Medicaid Other | Attending: Emergency Medicine | Admitting: Emergency Medicine

## 2016-09-01 DIAGNOSIS — I10 Essential (primary) hypertension: Secondary | ICD-10-CM | POA: Insufficient documentation

## 2016-09-01 DIAGNOSIS — J029 Acute pharyngitis, unspecified: Secondary | ICD-10-CM | POA: Insufficient documentation

## 2016-09-01 LAB — RAPID STREP SCREEN (MED CTR MEBANE ONLY): Streptococcus, Group A Screen (Direct): NEGATIVE

## 2016-09-01 MED ORDER — HYDROCODONE-ACETAMINOPHEN 7.5-325 MG/15ML PO SOLN: 15.0000 mL | Freq: Four times a day (QID) | ORAL | 0 refills | Status: AC | PRN

## 2016-09-01 NOTE — Discharge Instructions (Signed)
Try Zantac for the next week for reflux. If this does not help after a week you can stop this medicine. Please follow up with ENT.

## 2016-09-01 NOTE — ED Triage Notes (Addendum)
Pt reports dry cough and sore throat  x 3 weeks. Seen for same symptoms 1 week ago. C/o recurrent sore throat and dry cough. Throat red on r/side, no pustules noted

## 2016-09-01 NOTE — ED Notes (Signed)
Bed: WTR7 Expected date:  Expected time:  Means of arrival:  Comments: 

## 2016-09-01 NOTE — ED Provider Notes (Signed)
[facility] DEPT Provider Note   CSN: [id number] Arrival date & time: 09/01/16  [id number]  By signing my name below, I, Sonum Patel, attest that this documentation has been prepared under the direction and in the presence of [employer], PA-C. Electronically Signed: Sonum Patel, Education administrator. 09/01/16. 11:15 AM.  History   Chief Complaint Chief Complaint  Patient presents with  . Sore Throat  . Cough    The history is provided by the patient. No language interpreter was used.     HPI Comments: Tiffany Solomon is a 31 y.o. female who presents to the Emergency Department complaining of a persistent cough that has been ongoing for the past 3 weeks. She reports associated sore throat that has gradually worsened. She states it is worse with coughing, swallowing, and opening her jaw. She has tried hot and cold liquids as well as OTC sprays and lozenges without relief. She was seen in the ED on 08/24/16 and was prescribed Hycet cough syrup which she has finished without relief. Rapid strep and culture were negative from that visit. She denies fever, chills, rhinorrhea, ear pain, inability to swallow. She reports sick contacts at home. She denies symptoms are worse after eating or reflux-type symptoms.    Past Medical History:  Diagnosis Date  . Anemia   . Anxiety   . Blood transfusion without reported diagnosis   . Depression   . Dyspnea    with anxiety during pregnancy, no current issues  . Fibroid   . Headache    Migraines  . History of placenta abruption   . Hypertension    postpartum pre eclampsia, was re-hospitalize  . Miscarriage   . Oral abscess   . PVC's (premature ventricular contractions)    a. occ PVC seen on holter monitor 03/2016. During pregnancy  . S/P cesarean section 08/27/2014  . Sinus tachycardia    during pregnancy  . Traumatic injury during pregnancy, antepartum     Patient Active Problem List   Diagnosis Date Noted  . Preeclampsia in postpartum period 05/25/2016    . Pre-eclampsia in postpartum period 05/25/2016  . S/P cesarean section 05/15/2016  . Placental abruption 05/15/2016    Past Surgical History:  Procedure Laterality Date  . CESAREAN SECTION N/A 08/27/2014   Procedure: CESAREAN SECTION;  Surgeon: Maeola Sarah. Landry Mellow, MD;  Location: [facility];  Service: Obstetrics;  Laterality: N/A;  . CESAREAN SECTION N/A 05/14/2016   Procedure: CESAREAN SECTION;  Surgeon: Christophe Louis, MD;  Location: [facility];  Service: Obstetrics;  Laterality: N/A;  . DILATION AND CURETTAGE OF UTERUS    . LAPAROSCOPIC TUBAL LIGATION Bilateral 07/08/2016   Procedure: LAPAROSCOPIC TUBAL LIGATION;  Surgeon: Christophe Louis, MD;  Location: [facility];  Service: Gynecology;  Laterality: Bilateral;  . MYOMECTOMY    . ROOT CANAL    . WISDOM TOOTH EXTRACTION      OB History    Gravida Para Term Preterm AB Living   4 2   2 1 3    SAB TAB Ectopic Multiple Live Births   1     1 3        Home Medications    Prior to Admission medications   Medication Sig Start Date End Date Taking? Authorizing Provider  acetaminophen (TYLENOL) 500 MG tablet Take 1,000 mg by mouth every 6 (six) hours as needed for mild pain or headache.    Historical Provider, MD  amoxicillin (AMOXIL) 500 MG capsule Take 500 mg by mouth 4 (four) times daily. Pt  is having a root canal done next month, December.. She is taking the amoxicillin twice a day, buy when I called the pharmacy, they said the prescription was for 4 times a day.    Historical Provider, MD  HYDROcodone-acetaminophen (HYCET) 7.5-325 mg/15 ml solution Take 10 mLs by mouth every 6 (six) hours as needed for moderate pain. 08/24/16   Charlann Lange, PA-C  ibuprofen (ADVIL,MOTRIN) 600 MG tablet Take 1 tablet (600 mg total) by mouth every 6 (six) hours as needed for mild pain. 05/17/16   Christophe Louis, MD  ibuprofen (ADVIL,MOTRIN) 800 MG tablet 1 po every 8 hours as needed for pain 07/08/16   Christophe Louis, MD  NIFEdipine (PROCARDIA-XL/ADALAT CC) 60 MG 24 hr tablet 1 po  daily 05/27/16   Christophe Louis, MD  oxyCODONE-acetaminophen (ROXICET) 5-325 MG tablet Take 1-2 tablets by mouth every 4 (four) hours as needed for severe pain. 07/08/16   Christophe Louis, MD  sertraline (ZOLOFT) 50 MG tablet Take 1 tablet (50 mg total) by mouth daily. 05/27/16   Christophe Louis, MD    Family History Family History  Problem Relation Age of Onset  . Brain cancer Mother   . Heart disease Maternal Aunt   . Hypertension Maternal Aunt     Social History Social History  Substance Use Topics  . Smoking status: Never Smoker  . Smokeless tobacco: Never Used  . Alcohol use No     Allergies   Patient has no known allergies.   Review of Systems Review of Systems  Constitutional: Negative for chills and fever.  HENT: Positive for sore throat. Negative for ear pain, rhinorrhea and trouble swallowing.   Respiratory: Positive for cough.      Physical Exam Updated Vital Signs BP 130/79 (BP Location: Right Arm)   Pulse 104   Temp 98.5 F (36.9 C) (Oral)   Resp 12   Ht 5\' 5"  (1.651 m)   Wt 204 lb (92.5 kg)   LMP 08/19/2015   SpO2 99%   Breastfeeding? No   BMI 33.95 kg/m   Physical Exam  Constitutional: She is oriented to person, place, and time. She appears well-developed and well-nourished.  HENT:  Head: Normocephalic and atraumatic.  Right Ear: Tympanic membrane, external ear and ear canal normal.  Left Ear: Tympanic membrane, external ear and ear canal normal.  Nose: Nose normal.  Mouth/Throat: Oropharynx is clear and moist. No oropharyngeal exudate or posterior oropharyngeal edema.  Neck: Normal range of motion. Neck supple.  Cardiovascular: Normal rate, regular rhythm and normal heart sounds.   Pulmonary/Chest: Effort normal and breath sounds normal. No respiratory distress. She has no wheezes. She has no rales.  Lymphadenopathy:    She has no cervical adenopathy.  Neurological: She is alert and oriented to person, place, and time.  Skin: Skin is warm and dry.    Psychiatric: She has a normal mood and affect.  Nursing note and vitals reviewed.    ED Treatments / Results  DIAGNOSTIC STUDIES: Oxygen Saturation is 99% on RA, normal by my interpretation.    COORDINATION OF CARE: 11:13 AM Discussed treatment plan with pt at bedside and pt agreed to plan.   Labs (all labs ordered are listed, but only abnormal results are displayed) Labs Reviewed  RAPID STREP SCREEN (NOT AT [facility])  CULTURE, GROUP A STREP [facility])    EKG  EKG Interpretation None       Radiology No results found.  Procedures Procedures (including critical care time)  Medications Ordered in  ED Medications - No data to display   Initial Impression / Assessment and Plan / ED Course  I have reviewed the triage vital signs and the nursing notes.  Pertinent labs & imaging results that were available during my care of the patient were reviewed by me and considered in my medical decision making (see chart for details).  31 year old female presents with persistent sore throat and mild cough. Unclear etiology. Patient is afebrile, not tachycardic or tachypneic, normotensive, and not hypoxic. No signs of airway compromise. Rapid strep obtained in triage again is negative and on exam pharynx does not appear infected. Possibly reflux although she denies reflux symptoms. Advised ENT follow up and trial of antacids for the next week. Will refill Hycet. Patient verbalized understanding.  Final Clinical Impressions(s) / ED Diagnoses   Final diagnoses:  Sore throat    New Prescriptions Discharge Medication List as of 09/01/2016 11:22 AM     I personally performed the services described in this documentation, which was scribed in my presence. The recorded information has been reviewed and is accurate.    Recardo Evangelist, PA-C 09/01/16 [place], PA-C 09/01/16 [place], MD 09/03/16 2027

## 2016-09-03 LAB — CULTURE, GROUP A STREP (THRC)

## 2016-11-17 ENCOUNTER — Other Ambulatory Visit (HOSPITAL_COMMUNITY)
Admission: RE | Admit: 2016-11-17 | Discharge: 2016-11-17 | Disposition: A | Discharge status: Home / Self Care | Payer: Managed Care, Other (non HMO) | Source: Ambulatory Visit | Attending: Obstetrics and Gynecology | Admitting: Obstetrics and Gynecology

## 2016-11-17 ENCOUNTER — Other Ambulatory Visit: Payer: Self-pay | Admitting: Obstetrics and Gynecology

## 2016-11-17 DIAGNOSIS — Z1151 Encounter for screening for human papillomavirus (HPV): Secondary | ICD-10-CM | POA: Insufficient documentation

## 2016-11-17 DIAGNOSIS — Z01419 Encounter for gynecological examination (general) (routine) without abnormal findings: Secondary | ICD-10-CM | POA: Insufficient documentation

## 2016-11-18 LAB — CYTOLOGY - PAP
Diagnosis: NEGATIVE
HPV: NOT DETECTED

## 2016-11-30 ENCOUNTER — Encounter (HOSPITAL_COMMUNITY): Payer: Self-pay | Admitting: Obstetrics and Gynecology

## 2017-12-12 ENCOUNTER — Encounter: Payer: Self-pay | Admitting: Neurology

## 2017-12-14 ENCOUNTER — Other Ambulatory Visit: Payer: Self-pay | Admitting: *Deleted

## 2017-12-14 DIAGNOSIS — G5603 Carpal tunnel syndrome, bilateral upper limbs: Secondary | ICD-10-CM

## 2017-12-28 ENCOUNTER — Ambulatory Visit (INDEPENDENT_AMBULATORY_CARE_PROVIDER_SITE_OTHER): Payer: 59 | Admitting: Neurology

## 2017-12-28 DIAGNOSIS — G5603 Carpal tunnel syndrome, bilateral upper limbs: Secondary | ICD-10-CM

## 2017-12-28 NOTE — Procedures (Signed)
[facility]  [facility] [address], [address]  [place], [place] [address] Tel: [phone] Fax:  [phone] Test Date:  12/28/2017  Patient: Tiffany Solomon DOB: 17-Oct-1985 Physician: Narda Amber, DO  Sex: Female Height: 5\' 8"  Ref Phys: Almedia Balls, MD  ID#: [id number] Temp: 37.2C Technician:    Patient Complaints: This is a 32 year old female referred for evaluation of bilateral hand numbness, worse on the right.  NCV & EMG Findings: Extensive electrodiagnostic testing of the right upper extremity and additional studies of the left shows:  1. Right median sensory nerve showed prolonged distal peak latency (5.5 ms) and reduced amplitude (5.8 V).  The left median sensory response shows prolonged distal peak latency (4.0 ms) and normal amplitude. Bilateral ulnar sensory responses are within normal limits. 2. Right median motor response shows prolonged distal onset latency (5.7 ms) with preserved amplitude. Left median and bilateral ulnar motor responses are within normal limits. 3. There is no evidence of active or chronic motor axon loss changes affecting any of the tested muscles. Motor unit configuration and recruitment pattern is within normal limits.  Impression: 1. Right median neuropathy at or distal to the wrist, consistent with the clinical diagnosis of carpal tunnel syndrome; moderate-to-severe in degree electrically. 2. Left median neuropathy at or distal to the wrist, consistent with the clinical diagnosis of carpal tunnel syndrome; mild in degree electrically.    ___________________________ Narda Amber, DO    Nerve Conduction Studies Anti Sensory Summary Table   Site NR Peak (ms) Norm Peak (ms) P-T Amp (V) Norm P-T Amp  Left Median Anti Sensory (2nd Digit)  37.2C  Wrist    4.0 <3.4 24.3 >20  Right Median Anti Sensory (2nd Digit)  37.2C  Wrist    5.5 <3.4 5.8 >20  Left Ulnar Anti Sensory (5th Digit)  37.2C  Wrist    2.7 <3.1 34.0 >12  Right Ulnar Anti  Sensory (5th Digit)  37.2C  Wrist    2.4 <3.1 30.5 >12   Motor Summary Table   Site NR Onset (ms) Norm Onset (ms) O-P Amp (mV) Norm O-P Amp Site1 Site2 Delta-0 (ms) Dist (cm) Vel (m/s) Norm Vel (m/s)  Left Median Motor (Abd Poll Brev)  37.2C  Wrist    3.9 <3.9 14.6 >6 Elbow Wrist 4.8 26.0 54 >50  Elbow    8.7  14.2         Right Median Motor (Abd Poll Brev)  37.2C  Wrist    5.7 <3.9 12.3 >6 Elbow Wrist 5.2 29.0 56 >50  Elbow    10.9  12.1         Left Ulnar Motor (Abd Dig Minimi)  37.2C  Wrist    2.0 <3.1 8.5 >7 B Elbow Wrist 3.5 22.5 64 >50  B Elbow    5.5  7.2  A Elbow B Elbow 1.8 10.0 56 >50  A Elbow    7.3  6.2         Right Ulnar Motor (Abd Dig Minimi)  37.2C  Wrist    1.6 <3.1 9.0 >7 B Elbow Wrist 3.6 23.0 64 >50  B Elbow    5.2  8.7  A Elbow B Elbow 1.7 10.0 59 >50  A Elbow    6.9  7.8          EMG   Side Muscle Ins Act Fibs Psw Fasc Number Recrt Dur Dur. Amp Amp. Poly Poly. Comment  Right 1stDorInt Nml Nml Nml Nml Nml Nml Nml  Nml Nml Nml Nml Nml N/A  Right Abd Poll Brev Nml Nml Nml Nml Nml Nml Nml Nml Nml Nml Nml Nml N/A  Right FlexPolLong Nml Nml Nml Nml Nml Nml Nml Nml Nml Nml Nml Nml N/A  Right PronatorTeres Nml Nml Nml Nml Nml Nml Nml Nml Nml Nml Nml Nml N/A  Right Biceps Nml Nml Nml Nml Nml Nml Nml Nml Nml Nml Nml Nml N/A  Right Triceps Nml Nml Nml Nml Nml Nml Nml Nml Nml Nml Nml Nml N/A  Right Deltoid Nml Nml Nml Nml Nml Nml Nml Nml Nml Nml Nml Nml N/A  Left 1stDorInt Nml Nml Nml Nml Nml Nml Nml Nml Nml Nml Nml Nml N/A  Left Abd Poll Brev Nml Nml Nml Nml Nml Nml Nml Nml Nml Nml Nml Nml N/A  Left PronatorTeres Nml Nml Nml Nml Nml Nml Nml Nml Nml Nml Nml Nml N/A      Waveforms:

## 2018-01-08 ENCOUNTER — Ambulatory Visit: Payer: 59 | Admitting: Family Medicine

## 2018-01-31 ENCOUNTER — Ambulatory Visit: Payer: 59 | Admitting: Family Medicine

## 2018-04-20 ENCOUNTER — Encounter: Payer: Self-pay | Admitting: Family Medicine

## 2018-04-20 ENCOUNTER — Ambulatory Visit (INDEPENDENT_AMBULATORY_CARE_PROVIDER_SITE_OTHER): Payer: Managed Care, Other (non HMO) | Admitting: Family Medicine

## 2018-04-20 ENCOUNTER — Other Ambulatory Visit: Payer: Self-pay

## 2018-04-20 VITALS — BP 120/76 | HR 89 | Temp 98.2°F | Ht 65.0 in | Wt 241.8 lb

## 2018-04-20 DIAGNOSIS — F439 Reaction to severe stress, unspecified: Secondary | ICD-10-CM | POA: Diagnosis not present

## 2018-04-20 DIAGNOSIS — K649 Unspecified hemorrhoids: Secondary | ICD-10-CM | POA: Diagnosis not present

## 2018-04-20 DIAGNOSIS — F32A Depression, unspecified: Secondary | ICD-10-CM

## 2018-04-20 DIAGNOSIS — F329 Major depressive disorder, single episode, unspecified: Secondary | ICD-10-CM | POA: Diagnosis not present

## 2018-04-20 MED ORDER — SERTRALINE HCL 50 MG PO TABS
50.0000 mg | ORAL_TABLET | Freq: Every day | ORAL | 3 refills | Status: DC
Start: 2018-04-20 — End: 2018-06-01

## 2018-04-20 MED ORDER — HYDROCORTISONE 2.5 % RE CREA: 1.0000 "application " | TOPICAL_CREAM | Freq: Two times a day (BID) | RECTAL | 0 refills | Status: DC

## 2018-04-20 MED ORDER — LIDOCAINE 5 % EX CREA: 1.0000 "application " | TOPICAL_CREAM | Freq: Two times a day (BID) | CUTANEOUS | 1 refills | Status: AC | PRN

## 2018-04-20 NOTE — Progress Notes (Signed)
Subjective:  By signing my name below, I, Moises Blood, attest that this documentation has been prepared under the direction and in the presence of Merri Ray, MD. Electronically Signed: Moises Blood, [place]. 04/20/2018 , 4:11 PM .  Patient was seen in Room 2 .   Patient ID: Tiffany Solomon, female    DOB: 01-24-1986, 32 y.o.   MRN: [id number] Chief Complaint  Patient presents with  . Establish Care    stress and hemroids issues   . PHQ9= 10    diet management?   HPI Tiffany Solomon is a 32 y.o. female  She is a new patient to our practice and to me, with concerns of stress and hemorrhoid issues.   She is a single mother of 3 children, daughter of 50 years old, and twin boys turning 2 in Oct. 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   Stress On review of her chart, she has a history of depression with anxiety, not currently on medications. She was previously treated with Zoloft 50 mg qd; thought to have some component of post partem depression when treated in 2017.   Patient states she was started on Zoloft for about 6 months after having her twins. She chose to stop the medication because she didn't want to become dependent on a medication to function. She denies any side effects while on the Zoloft. But now, she feels like she needs it, as she's feeling sad all the time. She feels she isn't good enough for her children because she has so much many things on her plate. And because she's a strong independent individual, she is expected to handle the things on her plate. She denies any suicidal ideation but feels like there's a lot of stress on her shoulders. She hasn't been eating as she should. When going to sleep, her mind is still racing, thinking about the next day or what happened during that time; hasn't been able to relax. This is the first time she's really outspoken about her anxiety and stress. She notes she's been having this feeling for a little more than 6  months now. She knows there is a EAP program at work with 3 free sessions. She hasn't met with a therapist. She denies history of thyroid issues.   Possible hemorrhoid issues Patient states she's been having problems with hemorrhoids for about over a year now. She has pain and rectal bleeding every time she has a bowel movement, soft or hard. She states it also brings her to tears due to the pain it brings when she walks. She was seen by a facility on [place], and doctor prescribed her a cream because she didn't want to do the examination at the time. However, the cream wasn't effective; wasn't sure which cream she was prescribed. She denies receiving a numbing medication for her hemorrhoids. But due to hemorrhoids, she doesn't like to have bowel movements, and has been constipated about 1 bowel movement once a week. She uses mineral oil to soften her stools. At times, she is also straining for her bowel movements. She denies any unexplained weight loss, night sweats, chill or abdominal pain. She mentions her fiance had similar issues, and had hemorrhoids improved with a band on it. Patient states she's taken Colace with a gas pill after she had her daughter. She notes feeling something protruding.    Positive depression screening Depression screen [facility] 2/9 04/20/2018  Decreased Interest 0  Down, Depressed, Hopeless  2  PHQ - 2 Score 2  Altered sleeping 0  Tired, decreased energy 3  Change in appetite 3  Feeling bad or failure about yourself  2  Trouble concentrating 0  Moving slowly or fidgety/restless 0  Suicidal thoughts 0  PHQ-9 Score 10  Difficult doing work/chores Somewhat difficult     Patient Active Problem List   Diagnosis Date Noted  . Preeclampsia in postpartum period 05/25/2016  . Pre-eclampsia in postpartum period 05/25/2016  . S/P cesarean section 05/15/2016  . Placental abruption 05/15/2016   Past Medical History:  Diagnosis Date  . Anemia   . Anxiety   .  Blood transfusion without reported diagnosis   . Depression   . Dyspnea    with anxiety during pregnancy, no current issues  . Fibroid   . Headache    Migraines  . History of placenta abruption   . Hypertension    postpartum pre eclampsia, was re-hospitalize  . Miscarriage   . Oral abscess   . PVC's (premature ventricular contractions)    a. occ PVC seen on holter monitor 03/2016. During pregnancy  . S/P cesarean section 08/27/2014  . Sinus tachycardia    during pregnancy  . Traumatic injury during pregnancy, antepartum    Past Surgical History:  Procedure Laterality Date  . CESAREAN SECTION N/A 08/27/2014   Procedure: CESAREAN SECTION;  Surgeon: Maeola Sarah. Landry Mellow, MD;  Location: [facility];  Service: Obstetrics;  Laterality: N/A;  . CESAREAN SECTION MULTI-GESTATIONAL  05/14/2016   Procedure: CESAREAN SECTION MULTI-GESTATIONAL;  Surgeon: Christophe Louis, MD;  Location: [facility];  Service: Obstetrics;;  . DILATION AND CURETTAGE OF UTERUS    . LAPAROSCOPIC TUBAL LIGATION Bilateral 07/08/2016   Procedure: LAPAROSCOPIC TUBAL LIGATION;  Surgeon: Christophe Louis, MD;  Location: [facility];  Service: Gynecology;  Laterality: Bilateral;  . MYOMECTOMY    . ROOT CANAL    . WISDOM TOOTH EXTRACTION     No Known Allergies Prior to Admission medications   Medication Sig Start Date End Date Taking? Authorizing Provider  acetaminophen (TYLENOL) 500 MG tablet Take 1,000 mg by mouth every 6 (six) hours as needed for mild pain or headache.    [provider]  amoxicillin (AMOXIL) 500 MG capsule Take 500 mg by mouth 4 (four) times daily. Pt is having a root canal done next month, December.. She is taking the amoxicillin twice a day, buy when I called the pharmacy, they said the prescription was for 4 times a day.    [provider]  ibuprofen (ADVIL,MOTRIN) 600 MG tablet Take 1 tablet (600 mg total) by mouth every 6 (six) hours as needed for mild pain. 05/17/16   Christophe Louis, MD  ibuprofen  (ADVIL,MOTRIN) 800 MG tablet 1 po every 8 hours as needed for pain 07/08/16   Christophe Louis, MD  NIFEdipine (PROCARDIA-XL/ADALAT CC) 60 MG 24 hr tablet 1 po daily 05/27/16   Christophe Louis, MD  oxyCODONE-acetaminophen (ROXICET) 5-325 MG tablet Take 1-2 tablets by mouth every 4 (four) hours as needed for severe pain. 07/08/16   Christophe Louis, MD  sertraline (ZOLOFT) 50 MG tablet Take 1 tablet (50 mg total) by mouth daily. 05/27/16   Christophe Louis, MD   Social History   Socioeconomic History  . Marital status: Single    Spouse name: Not on file  . Number of children: Not on file  . Years of education: Not on file  . Highest education level: Not on file  Occupational History  .  Not on file  Social Needs  . Financial resource strain: Not on file  . Food insecurity:    Worry: Not on file    Inability: Not on file  . Transportation needs:    Medical: Not on file    Non-medical: Not on file  Tobacco Use  . Smoking status: Never Smoker  . Smokeless tobacco: Never Used  Substance and Sexual Activity  . Alcohol use: No  . Drug use: No  . Sexual activity: Yes    Comment: having tubes tide  Lifestyle  . Physical activity:    Days per week: Not on file    Minutes per session: Not on file  . Stress: Not on file  Relationships  . Social connections:    Talks on phone: Not on file    Gets together: Not on file    Attends religious service: Not on file    Active member of club or organization: Not on file    Attends meetings of clubs or organizations: Not on file    Relationship status: Not on file  . Intimate partner violence:    Fear of current or ex partner: Not on file    Emotionally abused: Not on file    Physically abused: Not on file    Forced sexual activity: Not on file  Other Topics Concern  . Not on file  Social History Narrative  . Not on file   Review of Systems  Constitutional: Negative for chills, fatigue, fever and unexpected weight change.  Respiratory: Negative for cough.     Gastrointestinal: Positive for anal bleeding, constipation and rectal pain. Negative for diarrhea, nausea and vomiting.  Skin: Negative for rash and wound.  Neurological: Negative for dizziness, weakness and headaches.  Psychiatric/Behavioral: Positive for sleep disturbance. Negative for self-injury and suicidal ideas. The patient is nervous/anxious.        Objective:   Physical Exam  Constitutional: She is oriented to person, place, and time. She appears well-developed and well-nourished. No distress.  HENT:  Head: Normocephalic and atraumatic.  Eyes: Pupils are equal, round, and reactive to light. EOM are normal.  Neck: Neck supple.  Cardiovascular: Normal rate.  Pulmonary/Chest: Effort normal. No respiratory distress.  Genitourinary: Rectal exam shows no fissure.  Genitourinary Comments: Flat, non thrombosed hemorrhoid at 9 o'clock, no fissures recognized, no rectal prolapse on brief exam; she was slightly tender over the hemorrhoid  Musculoskeletal: Normal range of motion.  Neurological: She is alert and oriented to person, place, and time.  Skin: Skin is warm and dry.  Psychiatric: She has a normal mood and affect. Her behavior is normal.  Nursing note and vitals reviewed.   Vitals:   04/20/18 1520  BP: 120/76  Pulse: 89  Temp: 98.2 F (36.8 C)  TempSrc: Oral  SpO2: 97%  Weight: 241 lb 12.8 oz (109.7 kg)  Height: '5\' 5"'  (1.651 m)       Assessment & Plan:  SELEENA REIMERS is a 32 y.o. female Hemorrhoids, unspecified hemorrhoid type - Plan: hydrocortisone (ANUSOL-HC) 2.5 % rectal cream, Lidocaine 5 % CREA, sertraline (ZOLOFT) 50 MG tablet  -External hemorrhoids likely related constipation.  Anoscopy was not performed, but based on her history may have component of internal hemorrhoids as well that prolapse.  -Handout on constipation, fluids and fiber in the diet, Colace or MiraLAX if needed short-term  -Anusol HC for hemorrhoids, lidocaine cream prior to bowel movement  to lessen discomfort  -Recheck 3 to 4 weeks to  determine if GI or general surgery eval needed  Depression, unspecified depression type Situational stress  -Suspect some component of depression with secondary adjustment disorder/overwhelmed with current stressors.  Various approaches discussed.   -Will restart Zoloft 50 mg daily - potential side effects discussed. Counseling through EAP program or individual numbers can be provided  -Handout given on stress and stress management  -Recheck 3 to 4 weeks.   Meds ordered this encounter  Medications  . hydrocortisone (ANUSOL-HC) 2.5 % rectal cream    Sig: Place 1 application rectally 2 (two) times daily.    Dispense:  30 g    Refill:  0  . Lidocaine 5 % CREA    Sig: Apply 1 application topically 2 (two) times daily as needed (TO PERIANAL SKIN AS NEEDED.).    Dispense:  15 g    Refill:  1  . sertraline (ZOLOFT) 50 MG tablet    Sig: Take 1 tablet (50 mg total) by mouth daily.    Dispense:  30 tablet    Refill:  3   Patient Instructions   Make sure to drink plenty of fluid, fiber in diet to lessen risk of constipation which can worsen hemorrhoids. Colace as stool softener, or Miralax if needed for softer stools. Anusol HC to help with acute hemorrhoids, lidocaine gel to anal area prior to BM if needed.  recheck in next 3-4 weeks to decide if gastroenterology or surgeon eval needed.   I would recommend restarting Zoloft for depression/anxiety symptoms. See other info on stress management below. Meeting with therapist may also be helpful, and would recommend trying EAP initially. Let me know if other numbers needed.   Return to the clinic or go to the nearest emergency room if any of your symptoms worsen or new symptoms occur.  Stress and Stress Management Stress is a normal reaction to life events. It is what you feel when life demands more than you are used to or more than you can handle. Some stress can be useful. For example, the stress  reaction can help you catch the last bus of the day, study for a test, or meet a deadline at work. But stress that occurs too often or for too long can cause problems. It can affect your emotional health and interfere with relationships and normal daily activities. Too much stress can weaken your immune system and increase your risk for physical illness. If you already have a medical problem, stress can make it worse. What are the causes? All sorts of life events may cause stress. An event that causes stress for one person may not be stressful for another person. Major life events commonly cause stress. These may be positive or negative. Examples include losing your job, moving into a new home, getting married, having a baby, or losing a loved one. Less obvious life events may also cause stress, especially if they occur day after day or in combination. Examples include working long hours, driving in traffic, caring for children, being in debt, or being in a difficult relationship. What are the signs or symptoms? Stress may cause emotional symptoms including, the following:  Anxiety. This is feeling worried, afraid, on edge, overwhelmed, or out of control.  Anger. This is feeling irritated or impatient.  Depression. This is feeling sad, down, helpless, or guilty.  Difficulty focusing, remembering, or making decisions.  Stress may cause physical symptoms, including the following:  Aches and pains. These may affect your head, neck, back, stomach, or other areas  of your body.  Tight muscles or clenched jaw.  Low energy or trouble sleeping.  Stress may cause unhealthy behaviors, including the following:  Eating to feel better (overeating) or skipping meals.  Sleeping too little, too much, or both.  Working too much or putting off tasks (procrastination).  Smoking, drinking alcohol, or using drugs to feel better.  How is this diagnosed? Stress is diagnosed through an assessment by your  health care provider. Your health care provider will ask questions about your symptoms and any stressful life events.Your health care provider will also ask about your medical history and may order blood tests or other tests. Certain medical conditions and medicine can cause physical symptoms similar to stress. Mental illness can cause emotional symptoms and unhealthy behaviors similar to stress. Your health care provider may refer you to a mental health professional for further evaluation. How is this treated? Stress management is the recommended treatment for stress.The goals of stress management are reducing stressful life events and coping with stress in healthy ways. Techniques for reducing stressful life events include the following:  Stress identification. Self-monitor for stress and identify what causes stress for you. These skills may help you to avoid some stressful events.  Time management. Set your priorities, keep a calendar of events, and learn to say "no." These tools can help you avoid making too many commitments.  Techniques for coping with stress include the following:  Rethinking the problem. Try to think realistically about stressful events rather than ignoring them or overreacting. Try to find the positives in a stressful situation rather than focusing on the negatives.  Exercise. Physical exercise can release both physical and emotional tension. The key is to find a form of exercise you enjoy and do it regularly.  Relaxation techniques. These relax the body and mind. Examples include yoga, meditation, tai chi, biofeedback, deep breathing, progressive muscle relaxation, listening to music, being out in nature, journaling, and other hobbies. Again, the key is to find one or more that you enjoy and can do regularly.  Healthy lifestyle. Eat a balanced diet, get plenty of sleep, and do not smoke. Avoid using alcohol or drugs to relax.  Strong support network. Spend time with  family, friends, or other people you enjoy being around.Express your feelings and talk things over with someone you trust.  Counseling or talktherapy with a mental health professional may be helpful if you are having difficulty managing stress on your own. Medicine is typically not recommended for the treatment of stress.Talk to your health care provider if you think you need medicine for symptoms of stress. Follow these instructions at home:  Keep all follow-up visits as directed by your health care provider.  Take all medicines as directed by your health care provider. Contact a health care provider if:  Your symptoms get worse or you start having new symptoms.  You feel overwhelmed by your problems and can no longer manage them on your own. Get help right away if:  You feel like hurting yourself or someone else. This information is not intended to replace advice given to you by your health care provider. Make sure you discuss any questions you have with your health care provider. Document Released: 01/11/2001 Document Revised: 12/24/2015 Document Reviewed: 03/12/2013 Elsevier Interactive Patient Education  2017 Else[employer] Inc.    Hemorrhoids Hemorrhoids are swollen veins in and around the rectum or anus. There are two types of hemorrhoids:  Internal hemorrhoids. These occur in the veins that are just inside  the rectum. They may poke through to the outside and become irritated and painful.  External hemorrhoids. These occur in the veins that are outside of the anus and can be felt as a painful swelling or hard lump near the anus.  Most hemorrhoids do not cause serious problems, and they can be managed with home treatments such as diet and lifestyle changes. If home treatments do not help your symptoms, procedures can be done to shrink or remove the hemorrhoids. What are the causes? This condition is caused by increased pressure in the anal area. This pressure may result from various  things, including:  Constipation.  Straining to have a bowel movement.  Diarrhea.  Pregnancy.  Obesity.  Sitting for long periods of time.  Heavy lifting or other activity that causes you to strain.  Anal sex.  What are the signs or symptoms? Symptoms of this condition include:  Pain.  Anal itching or irritation.  Rectal bleeding.  Leakage of stool (feces).  Anal swelling.  One or more lumps around the anus.  How is this diagnosed? This condition can often be diagnosed through a visual exam. Other exams or tests may also be done, such as:  Examination of the rectal area with a gloved hand (digital rectal exam).  Examination of the anal canal using a small tube (anoscope).  A blood test, if you have lost a significant amount of blood.  A test to look inside the colon (sigmoidoscopy or colonoscopy).  How is this treated? This condition can usually be treated at home. However, various procedures may be done if dietary changes, lifestyle changes, and other home treatments do not help your symptoms. These procedures can help make the hemorrhoids smaller or remove them completely. Some of these procedures involve surgery, and others do not. Common procedures include:  Rubber band ligation. Rubber bands are placed at the base of the hemorrhoids to cut off the blood supply to them.  Sclerotherapy. Medicine is injected into the hemorrhoids to shrink them.  Infrared coagulation. A type of light energy is used to get rid of the hemorrhoids.  Hemorrhoidectomy surgery. The hemorrhoids are surgically removed, and the veins that supply them are tied off.  Stapled hemorrhoidopexy surgery. A circular stapling device is used to remove the hemorrhoids and use staples to cut off the blood supply to them.  Follow these instructions at home: Eating and drinking  Eat foods that have a lot of fiber in them, such as whole grains, beans, nuts, fruits, and vegetables. Ask your health  care provider about taking products that have added fiber (fiber supplements).  Drink enough fluid to keep your urine clear or pale yellow. Managing pain and swelling  Take warm sitz baths for 20 minutes, 3-4 times a day to ease pain and discomfort.  If directed, apply ice to the affected area. Using ice packs between sitz baths may be helpful. ? Put ice in a plastic bag. ? Place a towel between your skin and the bag. ? Leave the ice on for 20 minutes, 2-3 times a day. General instructions  Take over-the-counter and prescription medicines only as told by your health care provider.  Use medicated creams or suppositories as told.  Exercise regularly.  Go to the bathroom when you have the urge to have a bowel movement. Do not wait.  Avoid straining to have bowel movements.  Keep the anal area dry and clean. Use wet toilet paper or moist towelettes after a bowel movement.  Do not  sit on the toilet for long periods of time. This increases blood pooling and pain. Contact a health care provider if:  You have increasing pain and swelling that are not controlled by treatment or medicine.  You have uncontrolled bleeding.  You have difficulty having a bowel movement, or you are unable to have a bowel movement.  You have pain or inflammation outside the area of the hemorrhoids. This information is not intended to replace advice given to you by your health care provider. Make sure you discuss any questions you have with your health care provider. Document Released: 07/15/2000 Document Revised: 12/16/2015 Document Reviewed: 04/01/2015 Elsevier Interactive Patient Education  2018 [employer].   Constipation, Adult Constipation is when a person has fewer bowel movements in a week than normal, has difficulty having a bowel movement, or has stools that are dry, hard, or larger than normal. Constipation may be caused by an underlying condition. It may become worse with age if a person takes  certain medicines and does not take in enough fluids. Follow these instructions at home: Eating and drinking   Eat foods that have a lot of fiber, such as fresh fruits and vegetables, whole grains, and beans.  Limit foods that are high in fat, low in fiber, or overly processed, such as french fries, hamburgers, cookies, candies, and soda.  Drink enough fluid to keep your urine clear or pale yellow. General instructions  Exercise regularly or as told by your health care provider.  Go to the restroom when you have the urge to go. Do not hold it in.  Take over-the-counter and prescription medicines only as told by your health care provider. These include any fiber supplements.  Practice pelvic floor retraining exercises, such as deep breathing while relaxing the lower abdomen and pelvic floor relaxation during bowel movements.  Watch your condition for any changes.  Keep all follow-up visits as told by your health care provider. This is important. Contact a health care provider if:  You have pain that gets worse.  You have a fever.  You do not have a bowel movement after 4 days.  You vomit.  You are not hungry.  You lose weight.  You are bleeding from the anus.  You have thin, pencil-like stools. Get help right away if:  You have a fever and your symptoms suddenly get worse.  You leak stool or have blood in your stool.  Your abdomen is bloated.  You have severe pain in your abdomen.  You feel dizzy or you faint. This information is not intended to replace advice given to you by your health care provider. Make sure you discuss any questions you have with your health care provider. Document Released: 04/15/2004 Document Revised: 02/05/2016 Document Reviewed: 01/06/2016 Elsevier Interactive Patient Education  [employer].    If you have lab work done today you will be contacted with your lab results within the next 2 weeks.  If you have not heard from [place] then  please contact us. The fastest way to get your results is to register for My Chart.   IF you received an x-ray today, you will receive an invoice from [facility]. Please contact [facility] at [phone] with questions or concerns regarding your invoice.   IF you received labwork today, you will receive an invoice from [facility]. Please contact [facility] at [phone] with questions or concerns regarding your invoice.   Our billing staff will not be able to assist you with questions regarding  bills from these companies.  You will be contacted with the lab results as soon as they are available. The fastest way to get your results is to activate your My Chart account. Instructions are located on the last page of this paperwork. If you have not heard from [place] regarding the results in 2 weeks, please contact this office.       I personally performed the services described in this documentation, which was scribed in my presence. The recorded information has been reviewed and considered for accuracy and completeness, addended by me as needed, and agree with information above.  Signed,   Merri Ray, MD [facility] [place].  04/22/18 9:52 PM

## 2018-04-20 NOTE — Patient Instructions (Addendum)
Make sure to drink plenty of fluid, fiber in diet to lessen risk of constipation which can worsen hemorrhoids. Colace as stool softener, or Miralax if needed for softer stools. Anusol HC to help with acute hemorrhoids, lidocaine gel to anal area prior to BM if needed.  recheck in next 3-4 weeks to decide if gastroenterology or surgeon eval needed.   I would recommend restarting Zoloft for depression/anxiety symptoms. See other info on stress management below. Meeting with therapist may also be helpful, and would recommend trying EAP initially. Let me know if other numbers needed.   Return to the clinic or go to the nearest emergency room if any of your symptoms worsen or new symptoms occur.  Stress and Stress Management Stress is a normal reaction to life events. It is what you feel when life demands more than you are used to or more than you can handle. Some stress can be useful. For example, the stress reaction can help you catch the last bus of the day, study for a test, or meet a deadline at work. But stress that occurs too often or for too long can cause problems. It can affect your emotional health and interfere with relationships and normal daily activities. Too much stress can weaken your immune system and increase your risk for physical illness. If you already have a medical problem, stress can make it worse. What are the causes? All sorts of life events may cause stress. An event that causes stress for one person may not be stressful for another person. Major life events commonly cause stress. These may be positive or negative. Examples include losing your job, moving into a new home, getting married, having a baby, or losing a loved one. Less obvious life events may also cause stress, especially if they occur day after day or in combination. Examples include working long hours, driving in traffic, caring for children, being in debt, or being in a difficult relationship. What are the signs or  symptoms? Stress may cause emotional symptoms including, the following:  Anxiety. This is feeling worried, afraid, on edge, overwhelmed, or out of control.  Anger. This is feeling irritated or impatient.  Depression. This is feeling sad, down, helpless, or guilty.  Difficulty focusing, remembering, or making decisions.  Stress may cause physical symptoms, including the following:  Aches and pains. These may affect your head, neck, back, stomach, or other areas of your body.  Tight muscles or clenched jaw.  Low energy or trouble sleeping.  Stress may cause unhealthy behaviors, including the following:  Eating to feel better (overeating) or skipping meals.  Sleeping too little, too much, or both.  Working too much or putting off tasks (procrastination).  Smoking, drinking alcohol, or using drugs to feel better.  How is this diagnosed? Stress is diagnosed through an assessment by your health care provider. Your health care provider will ask questions about your symptoms and any stressful life events.Your health care provider will also ask about your medical history and may order blood tests or other tests. Certain medical conditions and medicine can cause physical symptoms similar to stress. Mental illness can cause emotional symptoms and unhealthy behaviors similar to stress. Your health care provider may refer you to a mental health professional for further evaluation. How is this treated? Stress management is the recommended treatment for stress.The goals of stress management are reducing stressful life events and coping with stress in healthy ways. Techniques for reducing stressful life events include the following:  Stress  identification. Self-monitor for stress and identify what causes stress for you. These skills may help you to avoid some stressful events.  Time management. Set your priorities, keep a calendar of events, and learn to say "no." These tools can help you  avoid making too many commitments.  Techniques for coping with stress include the following:  Rethinking the problem. Try to think realistically about stressful events rather than ignoring them or overreacting. Try to find the positives in a stressful situation rather than focusing on the negatives.  Exercise. Physical exercise can release both physical and emotional tension. The key is to find a form of exercise you enjoy and do it regularly.  Relaxation techniques. These relax the body and mind. Examples include yoga, meditation, tai chi, biofeedback, deep breathing, progressive muscle relaxation, listening to music, being out in nature, journaling, and other hobbies. Again, the key is to find one or more that you enjoy and can do regularly.  Healthy lifestyle. Eat a balanced diet, get plenty of sleep, and do not smoke. Avoid using alcohol or drugs to relax.  Strong support network. Spend time with family, friends, or other people you enjoy being around.Express your feelings and talk things over with someone you trust.  Counseling or talktherapy with a mental health professional may be helpful if you are having difficulty managing stress on your own. Medicine is typically not recommended for the treatment of stress.Talk to your health care provider if you think you need medicine for symptoms of stress. Follow these instructions at home:  Keep all follow-up visits as directed by your health care provider.  Take all medicines as directed by your health care provider. Contact a health care provider if:  Your symptoms get worse or you start having new symptoms.  You feel overwhelmed by your problems and can no longer manage them on your own. Get help right away if:  You feel like hurting yourself or someone else. This information is not intended to replace advice given to you by your health care provider. Make sure you discuss any questions you have with your health care  provider. Document Released: 01/11/2001 Document Revised: 12/24/2015 Document Reviewed: 03/12/2013 Elsevier Interactive Patient Education  2017 [employer]    Hemorrhoids Hemorrhoids are swollen veins in and around the rectum or anus. There are two types of hemorrhoids:  Internal hemorrhoids. These occur in the veins that are just inside the rectum. They may poke through to the outside and become irritated and painful.  External hemorrhoids. These occur in the veins that are outside of the anus and can be felt as a painful swelling or hard lump near the anus.  Most hemorrhoids do not cause serious problems, and they can be managed with home treatments such as diet and lifestyle changes. If home treatments do not help your symptoms, procedures can be done to shrink or remove the hemorrhoids. What are the causes? This condition is caused by increased pressure in the anal area. This pressure may result from various things, including:  Constipation.  Straining to have a bowel movement.  Diarrhea.  Pregnancy.  Obesity.  Sitting for long periods of time.  Heavy lifting or other activity that causes you to strain.  Anal sex.  What are the signs or symptoms? Symptoms of this condition include:  Pain.  Anal itching or irritation.  Rectal bleeding.  Leakage of stool (feces).  Anal swelling.  One or more lumps around the anus.  How is this diagnosed? This condition can  often be diagnosed through a visual exam. Other exams or tests may also be done, such as:  Examination of the rectal area with a gloved hand (digital rectal exam).  Examination of the anal canal using a small tube (anoscope).  A blood test, if you have lost a significant amount of blood.  A test to look inside the colon (sigmoidoscopy or colonoscopy).  How is this treated? This condition can usually be treated at home. However, various procedures may be done if dietary changes, lifestyle changes, and  other home treatments do not help your symptoms. These procedures can help make the hemorrhoids smaller or remove them completely. Some of these procedures involve surgery, and others do not. Common procedures include:  Rubber band ligation. Rubber bands are placed at the base of the hemorrhoids to cut off the blood supply to them.  Sclerotherapy. Medicine is injected into the hemorrhoids to shrink them.  Infrared coagulation. A type of light energy is used to get rid of the hemorrhoids.  Hemorrhoidectomy surgery. The hemorrhoids are surgically removed, and the veins that supply them are tied off.  Stapled hemorrhoidopexy surgery. A circular stapling device is used to remove the hemorrhoids and use staples to cut off the blood supply to them.  Follow these instructions at home: Eating and drinking  Eat foods that have a lot of fiber in them, such as whole grains, beans, nuts, fruits, and vegetables. Ask your health care provider about taking products that have added fiber (fiber supplements).  Drink enough fluid to keep your urine clear or pale yellow. Managing pain and swelling  Take warm sitz baths for 20 minutes, 3-4 times a day to ease pain and discomfort.  If directed, apply ice to the affected area. Using ice packs between sitz baths may be helpful. ? Put ice in a plastic bag. ? Place a towel between your skin and the bag. ? Leave the ice on for 20 minutes, 2-3 times a day. General instructions  Take over-the-counter and prescription medicines only as told by your health care provider.  Use medicated creams or suppositories as told.  Exercise regularly.  Go to the bathroom when you have the urge to have a bowel movement. Do not wait.  Avoid straining to have bowel movements.  Keep the anal area dry and clean. Use wet toilet paper or moist towelettes after a bowel movement.  Do not sit on the toilet for long periods of time. This increases blood pooling and pain. Contact  a health care provider if:  You have increasing pain and swelling that are not controlled by treatment or medicine.  You have uncontrolled bleeding.  You have difficulty having a bowel movement, or you are unable to have a bowel movement.  You have pain or inflammation outside the area of the hemorrhoids. This information is not intended to replace advice given to you by your health care provider. Make sure you discuss any questions you have with your health care provider. Document Released: 07/15/2000 Document Revised: 12/16/2015 Document Reviewed: 04/01/2015 Elsevier Interactive Patient Education  2018 [employer].   Constipation, Adult Constipation is when a person has fewer bowel movements in a week than normal, has difficulty having a bowel movement, or has stools that are dry, hard, or larger than normal. Constipation may be caused by an underlying condition. It may become worse with age if a person takes certain medicines and does not take in enough fluids. Follow these instructions at home: Eating and drinking  Eat foods that have a lot of fiber, such as fresh fruits and vegetables, whole grains, and beans.  Limit foods that are high in fat, low in fiber, or overly processed, such as french fries, hamburgers, cookies, candies, and soda.  Drink enough fluid to keep your urine clear or pale yellow. General instructions  Exercise regularly or as told by your health care provider.  Go to the restroom when you have the urge to go. Do not hold it in.  Take over-the-counter and prescription medicines only as told by your health care provider. These include any fiber supplements.  Practice pelvic floor retraining exercises, such as deep breathing while relaxing the lower abdomen and pelvic floor relaxation during bowel movements.  Watch your condition for any changes.  Keep all follow-up visits as told by your health care provider. This is important. Contact a health care  provider if:  You have pain that gets worse.  You have a fever.  You do not have a bowel movement after 4 days.  You vomit.  You are not hungry.  You lose weight.  You are bleeding from the anus.  You have thin, pencil-like stools. Get help right away if:  You have a fever and your symptoms suddenly get worse.  You leak stool or have blood in your stool.  Your abdomen is bloated.  You have severe pain in your abdomen.  You feel dizzy or you faint. This information is not intended to replace advice given to you by your health care provider. Make sure you discuss any questions you have with your health care provider. Document Released: 04/15/2004 Document Revised: 02/05/2016 Document Reviewed: 01/06/2016 Elsevier Interactive Patient Education  [employer].    If you have lab work done today you will be contacted with your lab results within the next 2 weeks.  If you have not heard from [place] then please contact us. The fastest way to get your results is to register for My Chart.   IF you received an x-ray today, you will receive an invoice from [facility]. Please contact [facility] at [phone] with questions or concerns regarding your invoice.   IF you received labwork today, you will receive an invoice from [facility]. Please contact [facility] at [phone] with questions or concerns regarding your invoice.   Our billing staff will not be able to assist you with questions regarding bills from these companies.  You will be contacted with the lab results as soon as they are available. The fastest way to get your results is to activate your My Chart account. Instructions are located on the last page of this paperwork. If you have not heard from [place] regarding the results in 2 weeks, please contact this office.

## 2018-04-22 ENCOUNTER — Encounter: Payer: Self-pay | Admitting: Family Medicine

## 2018-05-24 ENCOUNTER — Ambulatory Visit: Payer: Managed Care, Other (non HMO) | Admitting: Family Medicine

## 2018-06-01 ENCOUNTER — Other Ambulatory Visit: Payer: Self-pay

## 2018-06-01 ENCOUNTER — Encounter: Payer: Self-pay | Admitting: Family Medicine

## 2018-06-01 ENCOUNTER — Ambulatory Visit: Payer: Managed Care, Other (non HMO) | Admitting: Family Medicine

## 2018-06-01 VITALS — BP 118/80 | HR 67 | Temp 98.1°F | Ht 64.0 in | Wt 245.8 lb

## 2018-06-01 DIAGNOSIS — K649 Unspecified hemorrhoids: Secondary | ICD-10-CM

## 2018-06-01 DIAGNOSIS — F32A Depression, unspecified: Secondary | ICD-10-CM

## 2018-06-01 DIAGNOSIS — G5600 Carpal tunnel syndrome, unspecified upper limb: Secondary | ICD-10-CM

## 2018-06-01 DIAGNOSIS — F329 Major depressive disorder, single episode, unspecified: Secondary | ICD-10-CM | POA: Diagnosis not present

## 2018-06-01 MED ORDER — SERTRALINE HCL 50 MG PO TABS: 50.0000 mg | ORAL_TABLET | Freq: Every day | ORAL | 1 refills | Status: DC

## 2018-06-01 NOTE — Patient Instructions (Addendum)
   I would recommend increasing the miralax to every other day for now. Decrease dose frequency if any diarrhea. I can also refer you to general surgery to evaluate symptoms further. Continue fiber in diet, make sure to drink plenty of water.   I would continue same dose of zoloft for now.  I would still recommend meeting with therapist to discuss techniques for stress management.   Some form of exercise few days per week for now.   See info on carpal tunnel below. I will refer you to hand specialist as well.  Try voice dictation or other ways to decrease typing as much as possible.  Wear brace while typing  Recheck in next 6 months.   Thank you for coming in today.  Let me know if there are questions.      If you have lab work done today you will be contacted with your lab results within the next 2 weeks.  If you have not heard from [place] then please contact us. The fastest way to get your results is to register for My Chart.   IF you received an x-ray today, you will receive an invoice from [facility]. Please contact [facility] at [phone] with questions or concerns regarding your invoice.   IF you received labwork today, you will receive an invoice from [facility]. Please contact [facility] at [phone] with questions or concerns regarding your invoice.   Our billing staff will not be able to assist you with questions regarding bills from these companies.  You will be contacted with the lab results as soon as they are available. The fastest way to get your results is to activate your My Chart account. Instructions are located on the last page of this paperwork. If you have not heard from [place] regarding the results in 2 weeks, please contact this office.

## 2018-06-01 NOTE — Progress Notes (Signed)
Subjective:  By signing my name below, I, Moises Blood, attest that this documentation has been prepared under the direction and in the presence of Merri Ray, MD. Electronically Signed: Moises Blood, Viola. 06/01/2018 , 10:31 AM .  Patient was seen in Room 8 .   Patient ID: Tiffany Solomon, female    DOB: [birth year], 32 y.o.   MRN: [id number] Chief Complaint  Patient presents with  . Carpal Tunnel    right hand mainly and moving to left   . Hemorrhoids    F/U   . Depression    phq 9=6   HPI Tiffany Solomon is a 32 y.o. female  Here for follow up of 2 concerns: hemorrhoids and depression.   Carpal tunnel  She also mentions having history of carpal tunnel in her right hand, but now having some symptoms in her left hand. She notes carpal tunnel started around 2018. She denies any known injury. She has been using a brace at work for about the past year in her right hand. When she's sleeping, her hands go numb, and has been hanging her hand down over the side of the bed. She states she had xray done about 6 months ago, showed carpal on the right, which did electrical test done on the right. She was supposed to follow up but hasn't gone. On Wednesday, she noticed some numbness in her finger tips on the left from 2nd to 5th fingers.   Hemorrhoids Patient reported 1 year history of hemorrhoid issues when seen in Sept with being constipated about once a week. She had flat non thrombosed hemorrhoid on exam at last visit. She was provided Anusol HC and Lidocaine cream; discussed bowel regime, Colace and Miralax for stool softener.   Patient states she's been applying Anusol HC about twice a week. She's been applying lidocaine cream which has been helping; last used yesterday. She is still straining to have bowel movements. She hasn't been using colace, and has been using Miralax about once a week. She denies having any diarrhea. She has a bowel movement about once every other day; in the  past, bowel movement was about once a week. She's been cutting back on bread, and has been eating more fruit. She describes having a sensation after bowel movements.   Depression See last visit; had restarted Zoloft 50 mg qd. Recommended counseling through EAP or separate numbers provided.   She reports initially feeling wired and couldn't sleep for the first 2 weeks after starting Zoloft. But starting in the 3rd week, she felt more balanced and even-mood on Zoloft. She's been more calm, and able to sleep more feeling relaxed. It's been helping her. She's been taking her Zoloft at night. Mood symptoms feel more controlled now. She's been having some feelings of anxiety due to balancing taking care of 3 children, work and school. She hasn't reached out to EAP program as her children have been sick, so she's been taking care of them first. She has been paying for her gym membership, but she hasn't been going.   Depression screen [facility] 2/9 06/01/2018 04/20/2018  Decreased Interest 0 0  Down, Depressed, Hopeless 2 2  PHQ - 2 Score 2 2  Altered sleeping 0 0  Tired, decreased energy 3 3  Change in appetite 0 3  Feeling bad or failure about yourself  1 2  Trouble concentrating 0 0  Moving slowly or fidgety/restless 0 0  Suicidal thoughts 0 0  PHQ-9 Score 6 10  Difficult doing work/chores Not difficult at all Somewhat difficult    Patient Active Problem List   Diagnosis Date Noted  . Preeclampsia in postpartum period 05/25/2016  . Pre-eclampsia in postpartum period 05/25/2016  . S/P cesarean section 05/15/2016  . Placental abruption 05/15/2016   Past Medical History:  Diagnosis Date  . Anemia   . Anxiety   . Blood transfusion without reported diagnosis   . Depression   . Dyspnea    with anxiety during pregnancy, no current issues  . Fibroid   . Headache    Migraines  . History of placenta abruption   . Hypertension    postpartum pre eclampsia, was re-hospitalize  . Miscarriage   .  Oral abscess   . PVC's (premature ventricular contractions)    a. occ PVC seen on holter monitor 03/2016. During pregnancy  . S/P cesarean section 08/27/2014  . Sinus tachycardia    during pregnancy  . Traumatic injury during pregnancy, antepartum    Past Surgical History:  Procedure Laterality Date  . CESAREAN SECTION N/A 08/27/2014   Procedure: CESAREAN SECTION;  Surgeon: Maeola Sarah. Landry Mellow, MD;  Location: [facility];  Service: Obstetrics;  Laterality: N/A;  . CESAREAN SECTION MULTI-GESTATIONAL  05/14/2016   Procedure: CESAREAN SECTION MULTI-GESTATIONAL;  Surgeon: Christophe Louis, MD;  Location: [facility];  Service: Obstetrics;;  . DILATION AND CURETTAGE OF UTERUS    . LAPAROSCOPIC TUBAL LIGATION Bilateral 07/08/2016   Procedure: LAPAROSCOPIC TUBAL LIGATION;  Surgeon: Christophe Louis, MD;  Location: [facility];  Service: Gynecology;  Laterality: Bilateral;  . MYOMECTOMY    . ROOT CANAL    . WISDOM TOOTH EXTRACTION     No Known Allergies Prior to Admission medications   Medication Sig Start Date End Date Taking? Authorizing Provider  hydrocortisone (ANUSOL-HC) 2.5 % rectal cream Place 1 application rectally 2 (two) times daily. 04/20/18   Wendie Agreste, MD  Lidocaine 5 % CREA Apply 1 application topically 2 (two) times daily as needed (TO PERIANAL SKIN AS NEEDED.). 04/20/18   Wendie Agreste, MD  sertraline (ZOLOFT) 50 MG tablet Take 1 tablet (50 mg total) by mouth daily. 04/20/18   Wendie Agreste, MD   Social History   Socioeconomic History  . Marital status: Single    Spouse name: Not on file  . Number of children: Not on file  . Years of education: Not on file  . Highest education level: Not on file  Occupational History  . Not on file  Social Needs  . Financial resource strain: Not on file  . Food insecurity:    Worry: Not on file    Inability: Not on file  . Transportation needs:    Medical: Not on file    Non-medical: Not on file  Tobacco Use  . Smoking status: Never Smoker  .  Smokeless tobacco: Never Used  Substance and Sexual Activity  . Alcohol use: No  . Drug use: No  . Sexual activity: Yes    Comment: having tubes tide  Lifestyle  . Physical activity:    Days per week: Not on file    Minutes per session: Not on file  . Stress: Not on file  Relationships  . Social connections:    Talks on phone: Not on file    Gets together: Not on file    Attends religious service: Not on file    Active member of club or organization: Not on file    Attends meetings of clubs  or organizations: Not on file    Relationship status: Not on file  . Intimate partner violence:    Fear of current or ex partner: Not on file    Emotionally abused: Not on file    Physically abused: Not on file    Forced sexual activity: Not on file  Other Topics Concern  . Not on file  Social History Narrative  . Not on file    Review of Systems  Constitutional: Negative for chills, fatigue, fever and unexpected weight change.  Respiratory: Negative for cough.   Gastrointestinal: Negative for constipation, diarrhea, nausea and vomiting.  Musculoskeletal: Positive for arthralgias (wrist pain).  Skin: Negative for rash and wound.  Neurological: Positive for numbness (hand + finger tips). Negative for dizziness, weakness and headaches.       Objective:   Physical Exam  Constitutional: She is oriented to person, place, and time. She appears well-developed and well-nourished. No distress.  HENT:  Head: Normocephalic and atraumatic.  Eyes: Pupils are equal, round, and reactive to light. EOM are normal.  Neck: Neck supple.  Cardiovascular: Normal rate.  Pulmonary/Chest: Effort normal. No respiratory distress.  Musculoskeletal: Normal range of motion.  Wrists: no bony tenderness at bilateral wrists, full ROM bilateral wrists, appears to have intact thenar vault, possible tinel on the right, negative tinel on the left; positive phalen's on the right  Neurological: She is alert and  oriented to person, place, and time.  Skin: Skin is warm and dry.  Psychiatric: She has a normal mood and affect. Her behavior is normal.  Nursing note and vitals reviewed.   Vitals:   06/01/18 0959  BP: 118/80  Pulse: 67  Temp: 98.1 F (36.7 C)  TempSrc: Oral  SpO2: 96%  Weight: 245 lb 12.8 oz (111.5 kg)  Height: 5\' 4"  (1.626 m)       Assessment & Plan:   AMEILIA RATTAN is a 32 y.o. female Hemorrhoids, unspecified hemorrhoid type - Plan: Ambulatory referral to General Surgery  -Improved, but still suspect some improved bowel regimen may help.  Recommended MiraLAX every other day for now with caution for diarrhea and dosing adjustment if necessary.  Will refer to colorectal surgeon for secondary exam and other recommendations on treatment.  Depression, unspecified depression type - Plan: sertraline (ZOLOFT) 50 MG tablet  -Improved on Zoloft.  Still recommended meeting with EAP therapist or other therapist to review stress management techniques.  Exercise discussed, including starting at low intensity and short duration for some form of exercise most days per week.  Recheck 6 months.  Carpal tunnel syndrome, unspecified laterality - Plan: Ambulatory referral to Hand Surgery  -Typical symptoms on right side, present for approximately 1 year, now with some left-sided symptoms but reassuring exam.  No sign of atrophy seen at this time.  Based on duration of symptoms, will refer to hand specialist.  Continue bracing, avoidance of typing as much as possible including use of voice dictation software if that helps.  Meds ordered this encounter  Medications  . sertraline (ZOLOFT) 50 MG tablet    Sig: Take 1 tablet (50 mg total) by mouth daily.    Dispense:  90 tablet    Refill:  1   Patient Instructions     I would recommend increasing the miralax to every other day for now. Decrease dose frequency if any diarrhea. I can also refer you to general surgery to evaluate symptoms  further. Continue fiber in diet, make sure to drink plenty  of water.   I would continue same dose of zoloft for now.  I would still recommend meeting with therapist to discuss techniques for stress management.   Some form of exercise few days per week for now.   See info on carpal tunnel below. I will refer you to hand specialist as well.  Try voice dictation or other ways to decrease typing as much as possible.  Wear brace while typing  Recheck in next 6 months.   Thank you for coming in today.  Let me know if there are questions.      If you have lab work done today you will be contacted with your lab results within the next 2 weeks.  If you have not heard from [place] then please contact us. The fastest way to get your results is to register for My Chart.   IF you received an x-ray today, you will receive an invoice from [facility]. Please contact [facility] at [phone] with questions or concerns regarding your invoice.   IF you received labwork today, you will receive an invoice from [facility]. Please contact [facility] at [phone] with questions or concerns regarding your invoice.   Our billing staff will not be able to assist you with questions regarding bills from these companies.  You will be contacted with the lab results as soon as they are available. The fastest way to get your results is to activate your My Chart account. Instructions are located on the last page of this paperwork. If you have not heard from [place] regarding the results in 2 weeks, please contact this office.       I personally performed the services described in this documentation, which was scribed in my presence. The recorded information has been reviewed and considered for accuracy and completeness, addended by me as needed, and agree with information above.  Signed,   Merri Ray, MD [facility].  06/01/18 10:47 AM

## 2018-06-05 ENCOUNTER — Ambulatory Visit (INDEPENDENT_AMBULATORY_CARE_PROVIDER_SITE_OTHER): Payer: Managed Care, Other (non HMO) | Admitting: Orthopaedic Surgery

## 2018-06-05 DIAGNOSIS — G5602 Carpal tunnel syndrome, left upper limb: Secondary | ICD-10-CM

## 2018-06-05 DIAGNOSIS — G5601 Carpal tunnel syndrome, right upper limb: Secondary | ICD-10-CM

## 2018-06-05 NOTE — Progress Notes (Signed)
Office Visit Note   Patient: Tiffany Solomon           Date of Birth: May 29, 1986           MRN: [id number] Visit Date: 06/05/2018              Requested by: Wendie Agreste, MD [address] [place], [place] [address] PCP: Patient, No Pcp Per   Assessment & Plan: Visit Diagnoses:  1. Right carpal tunnel syndrome   2. Left carpal tunnel syndrome     Plan: Impression is moderate to severe right carpal tunnel syndrome and mild left carpal tunnel syndrome.  We discussed cortisone injections versus a more definitive treatment of surgical release.  She wishes to schedule right carpal tunnel release at some point in January.  She will wear her nighttime braces to the left hand.  She will think about a cortisone injection for the left hand.  I gave her my surgery scheduler's card when she is ready to schedule surgery.  Follow-Up Instructions: Return if symptoms worsen or fail to improve.   Orders:  No orders of the defined types were placed in this encounter.  No orders of the defined types were placed in this encounter.     Procedures: No procedures performed   Clinical Data: No additional findings.   Subjective: Chief Complaint  Patient presents with  . Right Hand - Numbness, Pain    Ms. Sanzone is a 32 year old female comes in with bilateral carpal tunnel syndrome worse on the right.  She has had nerve conduction studies in May that showed moderate to severe right carpal tunnel syndrome and mild on the left.  She wears a carpal tunnel splint at all times which does help.  She has not had any injections.  She is a single mother of 3 small children.  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  She is also going to school.   Review of Systems  Constitutional: Negative.   HENT: Negative.   Eyes: Negative.   Respiratory: Negative.   Cardiovascular: Negative.   Endocrine: Negative.   Musculoskeletal: Negative.   Neurological: Negative.     Hematological: Negative.   Psychiatric/Behavioral: Negative.   All other systems reviewed and are negative.    Objective: Vital Signs: There were no vitals taken for this visit.  Physical Exam  Constitutional: She is oriented to person, place, and time. She appears well-developed and well-nourished.  HENT:  Head: Normocephalic and atraumatic.  Eyes: EOM are normal.  Neck: Neck supple.  Pulmonary/Chest: Effort normal.  Abdominal: Soft.  Neurological: She is alert and oriented to person, place, and time.  Skin: Skin is warm. Capillary refill takes less than 2 seconds.  Psychiatric: She has a normal mood and affect. Her behavior is normal. Judgment and thought content normal.  Nursing note and vitals reviewed.   Ortho Exam Bilateral hand exam shows negative carpal tunnel compressive signs.  She has subjective pain and numbness and tingling in the median nerve distribution.  No muscle atrophy. Specialty Comments:  No specialty comments available.  Imaging: No results found.   PMFS History: Patient Active Problem List   Diagnosis Date Noted  . Preeclampsia in postpartum period 05/25/2016  . Pre-eclampsia in postpartum period 05/25/2016  . S/P cesarean section 05/15/2016  . Placental abruption 05/15/2016   Past Medical History:  Diagnosis Date  . Anemia   . Anxiety   . Blood transfusion without reported diagnosis   .  Depression   . Dyspnea    with anxiety during pregnancy, no current issues  . Fibroid   . Headache    Migraines  . History of placenta abruption   . Hypertension    postpartum pre eclampsia, was re-hospitalize  . Miscarriage   . Oral abscess   . PVC's (premature ventricular contractions)    a. occ PVC seen on holter monitor 03/2016. During pregnancy  . S/P cesarean section 08/27/2014  . Sinus tachycardia    during pregnancy  . Traumatic injury during pregnancy, antepartum     Family History  Problem Relation Age of Onset  . Brain cancer Mother    . Heart disease Maternal Aunt   . Hypertension Maternal Aunt     Past Surgical History:  Procedure Laterality Date  . CESAREAN SECTION N/A 08/27/2014   Procedure: CESAREAN SECTION;  Surgeon: Maeola Sarah. Landry Mellow, MD;  Location: [facility];  Service: Obstetrics;  Laterality: N/A;  . CESAREAN SECTION MULTI-GESTATIONAL  05/14/2016   Procedure: CESAREAN SECTION MULTI-GESTATIONAL;  Surgeon: Christophe Louis, MD;  Location: [facility];  Service: Obstetrics;;  . DILATION AND CURETTAGE OF UTERUS    . LAPAROSCOPIC TUBAL LIGATION Bilateral 07/08/2016   Procedure: LAPAROSCOPIC TUBAL LIGATION;  Surgeon: Christophe Louis, MD;  Location: [facility];  Service: Gynecology;  Laterality: Bilateral;  . MYOMECTOMY    . ROOT CANAL    . WISDOM TOOTH EXTRACTION     Social History   Occupational History  . Not on file  Tobacco Use  . Smoking status: Never Smoker  . Smokeless tobacco: Never Used  Substance and Sexual Activity  . Alcohol use: No  . Drug use: No  . Sexual activity: Yes    Comment: having tubes tide

## 2018-08-28 ENCOUNTER — Telehealth (INDEPENDENT_AMBULATORY_CARE_PROVIDER_SITE_OTHER): Payer: Self-pay | Admitting: Orthopaedic Surgery

## 2018-08-28 NOTE — Telephone Encounter (Signed)
Patient called stated wanted to move forward with carpel tunnel surgery.  Please call patient to [phone]

## 2018-08-29 NOTE — Telephone Encounter (Signed)
Right CTR.  Bier block.  ASAP.  [id number].  [facility].

## 2018-08-29 NOTE — Telephone Encounter (Signed)
Please write surgery order?

## 2018-09-07 NOTE — Telephone Encounter (Signed)
I called patient and scheduled surgery. 

## 2018-10-04 ENCOUNTER — Encounter: Payer: Self-pay | Admitting: Family Medicine

## 2018-10-04 ENCOUNTER — Other Ambulatory Visit: Payer: Self-pay

## 2018-10-04 ENCOUNTER — Ambulatory Visit (INDEPENDENT_AMBULATORY_CARE_PROVIDER_SITE_OTHER): Payer: 59 | Admitting: Family Medicine

## 2018-10-04 VITALS — BP 133/82 | HR 103 | Temp 99.4°F | Resp 12 | Ht 65.0 in | Wt 246.0 lb

## 2018-10-04 DIAGNOSIS — R05 Cough: Secondary | ICD-10-CM | POA: Diagnosis not present

## 2018-10-04 DIAGNOSIS — R059 Cough, unspecified: Secondary | ICD-10-CM

## 2018-10-04 DIAGNOSIS — R52 Pain, unspecified: Secondary | ICD-10-CM | POA: Diagnosis not present

## 2018-10-04 DIAGNOSIS — R69 Illness, unspecified: Secondary | ICD-10-CM | POA: Diagnosis not present

## 2018-10-04 DIAGNOSIS — Z6841 Body Mass Index (BMI) 40.0 and over, adult: Secondary | ICD-10-CM

## 2018-10-04 DIAGNOSIS — J111 Influenza due to unidentified influenza virus with other respiratory manifestations: Secondary | ICD-10-CM

## 2018-10-04 LAB — POCT INFLUENZA A/B
INFLUENZA B, POC: NEGATIVE
Influenza A, POC: NEGATIVE

## 2018-10-04 MED ORDER — OSELTAMIVIR PHOSPHATE 75 MG PO CAPS: 75.0000 mg | ORAL_CAPSULE | Freq: Two times a day (BID) | ORAL | 0 refills | Status: DC

## 2018-10-04 MED ORDER — HYDROCODONE-HOMATROPINE 5-1.5 MG/5ML PO SYRP: ORAL_SOLUTION | ORAL | 0 refills | Status: DC

## 2018-10-04 MED ORDER — BENZONATATE 100 MG PO CAPS: 100.0000 mg | ORAL_CAPSULE | Freq: Three times a day (TID) | ORAL | 0 refills | Status: DC | PRN

## 2018-10-04 NOTE — Progress Notes (Signed)
Subjective:    Patient ID: Tiffany Solomon, female    DOB: [birth year], 33 y.o.   MRN: [id number]  HPI Tiffany Solomon is a 33 y.o. female Presents today for: Chief Complaint  Patient presents with  . Generalized Body Aches    bodyache, chills, fever x1 Patient stated her kids just got over the flu.    Started after lunch all of a sudden with body ache, chills, HA.  Fatigue. Cough with soreness from coughing.  Sweats overnight- intentional with heat overnight. Drinking fluids.  Hurts all over.   Sick contacts - kids with flu last week, sick contacts at work.   No flu shot this year.   Patient Active Problem List   Diagnosis Date Noted  . Preeclampsia in postpartum period 05/25/2016  . Pre-eclampsia in postpartum period 05/25/2016  . S/P cesarean section 05/15/2016  . Placental abruption 05/15/2016   Past Medical History:  Diagnosis Date  . Anemia   . Anxiety   . Blood transfusion without reported diagnosis   . Depression   . Dyspnea    with anxiety during pregnancy, no current issues  . Fibroid   . Headache    Migraines  . History of placenta abruption   . Hypertension    postpartum pre eclampsia, was re-hospitalize  . Miscarriage   . Oral abscess   . PVC's (premature ventricular contractions)    a. occ PVC seen on holter monitor 03/2016. During pregnancy  . S/P cesarean section 08/27/2014  . Sinus tachycardia    during pregnancy  . Traumatic injury during pregnancy, antepartum    Past Surgical History:  Procedure Laterality Date  . CESAREAN SECTION N/A 08/27/2014   Procedure: CESAREAN SECTION;  Surgeon: Maeola Sarah. Landry Mellow, MD;  Location: [facility];  Service: Obstetrics;  Laterality: N/A;  . CESAREAN SECTION MULTI-GESTATIONAL  05/14/2016   Procedure: CESAREAN SECTION MULTI-GESTATIONAL;  Surgeon: Christophe Louis, MD;  Location: [facility];  Service: Obstetrics;;  . DILATION AND CURETTAGE OF UTERUS    . LAPAROSCOPIC TUBAL LIGATION Bilateral 07/08/2016   Procedure:  LAPAROSCOPIC TUBAL LIGATION;  Surgeon: Christophe Louis, MD;  Location: [facility];  Service: Gynecology;  Laterality: Bilateral;  . MYOMECTOMY    . ROOT CANAL    . WISDOM TOOTH EXTRACTION     No Known Allergies Prior to Admission medications   Medication Sig Start Date End Date Taking? Authorizing Provider  hydrocortisone (ANUSOL-HC) 2.5 % rectal cream Place 1 application rectally 2 (two) times daily. 04/20/18  Yes Wendie Agreste, MD  Lidocaine 5 % CREA Apply 1 application topically 2 (two) times daily as needed (TO PERIANAL SKIN AS NEEDED.). 04/20/18  Yes Wendie Agreste, MD  sertraline (ZOLOFT) 50 MG tablet Take 1 tablet (50 mg total) by mouth daily. 06/01/18  Yes Wendie Agreste, MD   Social History   Socioeconomic History  . Marital status: Single    Spouse name: Not on file  . Number of children: Not on file  . Years of education: Not on file  . Highest education level: Not on file  Occupational History  . Not on file  Social Needs  . Financial resource strain: Not on file  . Food insecurity:    Worry: Not on file    Inability: Not on file  . Transportation needs:    Medical: Not on file    Non-medical: Not on file  Tobacco Use  . Smoking status: Never Smoker  . Smokeless tobacco: Never Used  Substance and Sexual Activity  . Alcohol use: No  . Drug use: No  . Sexual activity: Yes    Comment: having tubes tide  Lifestyle  . Physical activity:    Days per week: Not on file    Minutes per session: Not on file  . Stress: Not on file  Relationships  . Social connections:    Talks on phone: Not on file    Gets together: Not on file    Attends religious service: Not on file    Active member of club or organization: Not on file    Attends meetings of clubs or organizations: Not on file    Relationship status: Not on file  . Intimate partner violence:    Fear of current or ex partner: Not on file    Emotionally abused: Not on file    Physically abused: Not on file     Forced sexual activity: Not on file  Other Topics Concern  . Not on file  Social History Narrative  . Not on file    Review of Systems     Objective:   Physical Exam Vitals signs reviewed.  Constitutional:      General: She is not in acute distress.    Appearance: She is well-developed.  HENT:     Head: Normocephalic and atraumatic.     Right Ear: Hearing, tympanic membrane, ear canal and external ear normal.     Left Ear: Hearing, tympanic membrane, ear canal and external ear normal.     Nose: Nose normal.     Mouth/Throat:     Pharynx: No oropharyngeal exudate.  Eyes:     Conjunctiva/sclera: Conjunctivae normal.     Pupils: Pupils are equal, round, and reactive to light.  Cardiovascular:     Rate and Rhythm: Normal rate and regular rhythm.     Heart sounds: Normal heart sounds. No murmur.  Pulmonary:     Effort: Pulmonary effort is normal. No respiratory distress.     Breath sounds: Normal breath sounds. No wheezing or rhonchi.  Skin:    General: Skin is warm and dry.     Findings: No rash.  Neurological:     Mental Status: She is alert and oriented to person, place, and time.  Psychiatric:        Behavior: Behavior normal.    Vitals:   10/04/18 1722  BP: 133/82  Pulse: (!) 103  Resp: 12  Temp: 99.4 F (37.4 C)  TempSrc: Oral  SpO2: 100%  Weight: 246 lb (111.6 kg)  Height: 5\' 5"  (1.651 m)          Assessment & Plan:  Tiffany Solomon is a 33 y.o. female Body aches - Plan: POCT Influenza A/B  Influenza-like illness - Plan: oseltamivir (TAMIFLU) 75 MG capsule, DISCONTINUED: oseltamivir (TAMIFLU) 75 MG capsule  BMI 40.0-44.9, adult (HCC) - Plan: oseltamivir (TAMIFLU) 75 MG capsule, DISCONTINUED: oseltamivir (TAMIFLU) 75 MG capsule  Cough - Plan: benzonatate (TESSALON) 100 MG capsule, HYDROcodone-homatropine (HYCODAN) 5-1.5 MG/5ML syrup, DISCONTINUED: benzonatate (TESSALON) 100 MG capsule, DISCONTINUED: HYDROcodone-homatropine (HYCODAN) 5-1.5 MG/5ML  syrup  Influenza-like illness with multiple sick contacts with influenza at home.  Suspected false negative testing.  BMI over 40, and within 48 hours, started Tamiflu.  Tessalon Perles or Mucinex for cough, antipyretics discussed, hydrocodone cough syrup if needed at bedtime, side effects discussed.  Symptomatic care and RTC precautions discussed, note provided for work and return to work precautions discussed  Meds ordered this encounter  Medications  . DISCONTD: benzonatate (TESSALON) 100 MG capsule    Sig: Take 1 capsule (100 mg total) by mouth 3 (three) times daily as needed for cough.    Dispense:  20 capsule    Refill:  0  . DISCONTD: HYDROcodone-homatropine (HYCODAN) 5-1.5 MG/5ML syrup    Sig: 71m by mouth a bedtime as needed for cough.    Dispense:  120 mL    Refill:  0  . DISCONTD: oseltamivir (TAMIFLU) 75 MG capsule    Sig: Take 1 capsule (75 mg total) by mouth 2 (two) times daily.    Dispense:  10 capsule    Refill:  0  . benzonatate (TESSALON) 100 MG capsule    Sig: Take 1 capsule (100 mg total) by mouth 3 (three) times daily as needed for cough.    Dispense:  20 capsule    Refill:  0  . HYDROcodone-homatropine (HYCODAN) 5-1.5 MG/5ML syrup    Sig: 68m by mouth a bedtime as needed for cough.    Dispense:  120 mL    Refill:  0  . oseltamivir (TAMIFLU) 75 MG capsule    Sig: Take 1 capsule (75 mg total) by mouth 2 (two) times daily.    Dispense:  10 capsule    Refill:  0   Patient Instructions   Can start Tamiflu as discussed. Tessalon perles or over the counter mucinex or mucinex DM as needed for cough during the day, hydrocodone cough syrup at night if needed. tylenol or ibuprofen over the counter for fever and body aches, and drink plenty of fluids. Other information as in instructions below.  Return to the clinic or go to the nearest emergency room if any of your symptoms worsen or new symptoms occur.  Do not return to work until you have been fever free for 24  hours.  Influenza, Adult Influenza, more commonly known as "the flu," is a viral infection that mainly affects the respiratory tract. The respiratory tract includes organs that help you breathe, such as the lungs, nose, and throat. The flu causes many symptoms similar to the common cold along with high fever and body aches. The flu spreads easily from person to person (is contagious). Getting a flu shot (influenza vaccination) every year is the best way to prevent the flu. What are the causes? This condition is caused by the influenza virus. You can get the virus by:  Breathing in droplets that are in the air from an infected person's cough or sneeze.  Touching something that has been exposed to the virus (has been contaminated) and then touching your mouth, nose, or eyes. What increases the risk? The following factors may make you more likely to get the flu:  Not washing or sanitizing your hands often.  Having close contact with many people during cold and flu season.  Touching your mouth, eyes, or nose without first washing or sanitizing your hands.  Not getting a yearly (annual) flu shot. You may have a higher risk for the flu, including serious problems such as a lung infection (pneumonia), if you:  Are older than 65.  Are pregnant.  Have a weakened disease-fighting system (immune system). You may have a weakened immune system if you: ? Have HIV or AIDS. ? Are undergoing chemotherapy. ? Are taking medicines that reduce (suppress) the activity of your immune system.  Have a long-term (chronic) illness, such as heart disease, kidney disease, diabetes, or lung disease.  Have a liver disorder.  Are severely  overweight (morbidly obese).  Have anemia. This is a condition that affects your red blood cells.  Have asthma. What are the signs or symptoms? Symptoms of this condition usually begin suddenly and last 4-14 days. They may include:  Fever and chills.  Headaches, body  aches, or muscle aches.  Sore throat.  Cough.  Runny or stuffy (congested) nose.  Chest discomfort.  Poor appetite.  Weakness or fatigue.  Dizziness.  Nausea or vomiting. How is this diagnosed? This condition may be diagnosed based on:  Your symptoms and medical history.  A physical exam.  Swabbing your nose or throat and testing the fluid for the influenza virus. How is this treated? If the flu is diagnosed early, you can be treated with medicine that can help reduce how severe the illness is and how long it lasts (antiviral medicine). This may be given by mouth (orally) or through an IV. Taking care of yourself at home can help relieve symptoms. Your health care provider may recommend:  Taking over-the-counter medicines.  Drinking plenty of fluids. In many cases, the flu goes away on its own. If you have severe symptoms or complications, you may be treated in a hospital. Follow these instructions at home: Activity  Rest as needed and get plenty of sleep.  Stay home from work or school as told by your health care provider. Unless you are visiting your health care provider, avoid leaving home until your fever has been gone for 24 hours without taking medicine. Eating and drinking  Take an oral rehydration solution (ORS). This is a drink that is sold at pharmacies and retail stores.  Drink enough fluid to keep your urine pale yellow.  Drink clear fluids in small amounts as you are able. Clear fluids include water, ice chips, diluted fruit juice, and low-calorie sports drinks.  Eat bland, easy-to-digest foods in small amounts as you are able. These foods include bananas, applesauce, rice, lean meats, toast, and crackers.  Avoid drinking fluids that contain a lot of sugar or caffeine, such as energy drinks, regular sports drinks, and soda.  Avoid alcohol.  Avoid spicy or fatty foods. General instructions      Take over-the-counter and prescription medicines  only as told by your health care provider.  Use a cool mist humidifier to add humidity to the air in your home. This can make it easier to breathe.  Cover your mouth and nose when you cough or sneeze.  Wash your hands with soap and water often, especially after you cough or sneeze. If soap and water are not available, use alcohol-based hand sanitizer.  Keep all follow-up visits as told by your health care provider. This is important. How is this prevented?   Get an annual flu shot. You may get the flu shot in late summer, fall, or winter. Ask your health care provider when you should get your flu shot.  Avoid contact with people who are sick during cold and flu season. This is generally fall and winter. Contact a health care provider if:  You develop new symptoms.  You have: ? Chest pain. ? Diarrhea. ? A fever.  Your cough gets worse.  You produce more mucus.  You feel nauseous or you vomit. Get help right away if:  You develop shortness of breath or difficulty breathing.  Your skin or nails turn a bluish color.  You have severe pain or stiffness in your neck.  You develop a sudden headache or sudden pain in your face or  ear.  You cannot eat or drink without vomiting. Summary  Influenza, more commonly known as "the flu," is a viral infection that primarily affects your respiratory tract.  Symptoms of the flu usually begin suddenly and last 4-14 days.  Getting an annual flu shot is the best way to prevent getting the flu.  Stay home from work or school as told by your health care provider. Unless you are visiting your health care provider, avoid leaving home until your fever has been gone for 24 hours without taking medicine.  Keep all follow-up visits as told by your health care provider. This is important. This information is not intended to replace advice given to you by your health care provider. Make sure you discuss any questions you have with your health care  provider. Document Released: 07/15/2000 Document Revised: 01/03/2018 Document Reviewed: 01/03/2018 Elsevier Interactive Patient Education  [employer].     If you have lab work done today you will be contacted with your lab results within the next 2 weeks.  If you have not heard from [place] then please contact us. The fastest way to get your results is to register for My Chart.   IF you received an x-ray today, you will receive an invoice from [facility]. Please contact [facility] at [phone] with questions or concerns regarding your invoice.   IF you received labwork today, you will receive an invoice from [facility]. Please contact [facility] at [phone] with questions or concerns regarding your invoice.   Our billing staff will not be able to assist you with questions regarding bills from these companies.  You will be contacted with the lab results as soon as they are available. The fastest way to get your results is to activate your My Chart account. Instructions are located on the last page of this paperwork. If you have not heard from [place] regarding the results in 2 weeks, please contact this office.       Signed,   Merri Ray, MD [facility].  10/04/18 6:13 PM

## 2018-10-04 NOTE — Patient Instructions (Addendum)
Can start Tamiflu as discussed. Tessalon perles or over the counter mucinex or mucinex DM as needed for cough during the day, hydrocodone cough syrup at night if needed. tylenol or ibuprofen over the counter for fever and body aches, and drink plenty of fluids. Other information as in instructions below.  Return to the clinic or go to the nearest emergency room if any of your symptoms worsen or new symptoms occur.  Do not return to work until you have been fever free for 24 hours.  Influenza, Adult Influenza, more commonly known as "the flu," is a viral infection that mainly affects the respiratory tract. The respiratory tract includes organs that help you breathe, such as the lungs, nose, and throat. The flu causes many symptoms similar to the common cold along with high fever and body aches. The flu spreads easily from person to person (is contagious). Getting a flu shot (influenza vaccination) every year is the best way to prevent the flu. What are the causes? This condition is caused by the influenza virus. You can get the virus by:  Breathing in droplets that are in the air from an infected person's cough or sneeze.  Touching something that has been exposed to the virus (has been contaminated) and then touching your mouth, nose, or eyes. What increases the risk? The following factors may make you more likely to get the flu:  Not washing or sanitizing your hands often.  Having close contact with many people during cold and flu season.  Touching your mouth, eyes, or nose without first washing or sanitizing your hands.  Not getting a yearly (annual) flu shot. You may have a higher risk for the flu, including serious problems such as a lung infection (pneumonia), if you:  Are older than 65.  Are pregnant.  Have a weakened disease-fighting system (immune system). You may have a weakened immune system if you: ? Have HIV or AIDS. ? Are undergoing chemotherapy. ? Are taking medicines  that reduce (suppress) the activity of your immune system.  Have a long-term (chronic) illness, such as heart disease, kidney disease, diabetes, or lung disease.  Have a liver disorder.  Are severely overweight (morbidly obese).  Have anemia. This is a condition that affects your red blood cells.  Have asthma. What are the signs or symptoms? Symptoms of this condition usually begin suddenly and last 4-14 days. They may include:  Fever and chills.  Headaches, body aches, or muscle aches.  Sore throat.  Cough.  Runny or stuffy (congested) nose.  Chest discomfort.  Poor appetite.  Weakness or fatigue.  Dizziness.  Nausea or vomiting. How is this diagnosed? This condition may be diagnosed based on:  Your symptoms and medical history.  A physical exam.  Swabbing your nose or throat and testing the fluid for the influenza virus. How is this treated? If the flu is diagnosed early, you can be treated with medicine that can help reduce how severe the illness is and how long it lasts (antiviral medicine). This may be given by mouth (orally) or through an IV. Taking care of yourself at home can help relieve symptoms. Your health care provider may recommend:  Taking over-the-counter medicines.  Drinking plenty of fluids. In many cases, the flu goes away on its own. If you have severe symptoms or complications, you may be treated in a hospital. Follow these instructions at home: Activity  Rest as needed and get plenty of sleep.  Stay home from work or school as told  by your health care provider. Unless you are visiting your health care provider, avoid leaving home until your fever has been gone for 24 hours without taking medicine. Eating and drinking  Take an oral rehydration solution (ORS). This is a drink that is sold at pharmacies and retail stores.  Drink enough fluid to keep your urine pale yellow.  Drink clear fluids in small amounts as you are able. Clear  fluids include water, ice chips, diluted fruit juice, and low-calorie sports drinks.  Eat bland, easy-to-digest foods in small amounts as you are able. These foods include bananas, applesauce, rice, lean meats, toast, and crackers.  Avoid drinking fluids that contain a lot of sugar or caffeine, such as energy drinks, regular sports drinks, and soda.  Avoid alcohol.  Avoid spicy or fatty foods. General instructions      Take over-the-counter and prescription medicines only as told by your health care provider.  Use a cool mist humidifier to add humidity to the air in your home. This can make it easier to breathe.  Cover your mouth and nose when you cough or sneeze.  Wash your hands with soap and water often, especially after you cough or sneeze. If soap and water are not available, use alcohol-based hand sanitizer.  Keep all follow-up visits as told by your health care provider. This is important. How is this prevented?   Get an annual flu shot. You may get the flu shot in late summer, fall, or winter. Ask your health care provider when you should get your flu shot.  Avoid contact with people who are sick during cold and flu season. This is generally fall and winter. Contact a health care provider if:  You develop new symptoms.  You have: ? Chest pain. ? Diarrhea. ? A fever.  Your cough gets worse.  You produce more mucus.  You feel nauseous or you vomit. Get help right away if:  You develop shortness of breath or difficulty breathing.  Your skin or nails turn a bluish color.  You have severe pain or stiffness in your neck.  You develop a sudden headache or sudden pain in your face or ear.  You cannot eat or drink without vomiting. Summary  Influenza, more commonly known as "the flu," is a viral infection that primarily affects your respiratory tract.  Symptoms of the flu usually begin suddenly and last 4-14 days.  Getting an annual flu shot is the best way  to prevent getting the flu.  Stay home from work or school as told by your health care provider. Unless you are visiting your health care provider, avoid leaving home until your fever has been gone for 24 hours without taking medicine.  Keep all follow-up visits as told by your health care provider. This is important. This information is not intended to replace advice given to you by your health care provider. Make sure you discuss any questions you have with your health care provider. Document Released: 07/15/2000 Document Revised: 01/03/2018 Document Reviewed: 01/03/2018 Elsevier Interactive Patient Education  [employer].     If you have lab work done today you will be contacted with your lab results within the next 2 weeks.  If you have not heard from [place] then please contact us. The fastest way to get your results is to register for My Chart.   IF you received an x-ray today, you will receive an invoice from [facility]. Please contact [facility] at [phone] with questions or concerns  concerns regarding your invoice.   IF you received labwork today, you will receive an invoice from [facility]. Please contact [facility] at [phone] with questions or concerns regarding your invoice.   Our billing staff will not be able to assist you with questions regarding bills from these companies.  You will be contacted with the lab results as soon as they are available. The fastest way to get your results is to activate your My Chart account. Instructions are located on the last page of this paperwork. If you have not heard from us regarding the results in 2 weeks, please contact this office.      

## 2018-10-13 ENCOUNTER — Other Ambulatory Visit: Payer: Self-pay | Admitting: Family Medicine

## 2018-10-13 DIAGNOSIS — K649 Unspecified hemorrhoids: Secondary | ICD-10-CM

## 2018-10-15 NOTE — Telephone Encounter (Signed)
Requested medication (s) are due for refill today: yes  Requested medication (s) are on the active medication list: yes  Last refill:  04/20/18  Future visit scheduled: yes  Notes to clinic:  Medication not assigned to a protocol.  Requested Prescriptions  Pending Prescriptions Disp Refills   PROCTOZONE-HC 2.5 % rectal cream [Pharmacy Med Name: Proctozone-HC 2.5 % Rectal Cream] 30 g 0    Sig: PLACE ONE APPLICATION RECTALLY TWO TIMES DAILY     Off-Protocol Failed - 10/13/2018  8:18 AM      Failed - Medication not assigned to a protocol, review manually.      Passed - Valid encounter within last 12 months    Recent Outpatient Visits          1 week ago Body aches   [facility], Ranell Patrick, MD   4 months ago Hemorrhoids, unspecified hemorrhoid type   [facility], MD   5 months ago Hemorrhoids, unspecified hemorrhoid type   [facility], Ranell Patrick, MD      Future Appointments            In 1 month Carlota Raspberry Ranell Patrick, MD [facility] [place], [facility]

## 2018-12-03 ENCOUNTER — Ambulatory Visit: Payer: Managed Care, Other (non HMO) | Admitting: Family Medicine

## 2018-12-10 ENCOUNTER — Other Ambulatory Visit: Payer: Self-pay

## 2018-12-10 ENCOUNTER — Telehealth (INDEPENDENT_AMBULATORY_CARE_PROVIDER_SITE_OTHER): Payer: 59 | Admitting: Family Medicine

## 2018-12-10 DIAGNOSIS — K625 Hemorrhage of anus and rectum: Secondary | ICD-10-CM

## 2018-12-10 DIAGNOSIS — F32A Depression, unspecified: Secondary | ICD-10-CM

## 2018-12-10 DIAGNOSIS — K649 Unspecified hemorrhoids: Secondary | ICD-10-CM

## 2018-12-10 DIAGNOSIS — F329 Major depressive disorder, single episode, unspecified: Secondary | ICD-10-CM | POA: Diagnosis not present

## 2018-12-10 DIAGNOSIS — L989 Disorder of the skin and subcutaneous tissue, unspecified: Secondary | ICD-10-CM | POA: Diagnosis not present

## 2018-12-10 MED ORDER — SERTRALINE HCL 50 MG PO TABS: 50.0000 mg | ORAL_TABLET | Freq: Every day | ORAL | 1 refills | Status: AC

## 2018-12-10 NOTE — Progress Notes (Signed)
Virtual Visit via Telephone Note  I connected with Tiffany Solomon on 12/10/18 at 8:11 AM by telephone and verified that I am speaking with the correct person using two identifiers.   I discussed the limitations, risks, security and privacy concerns of performing an evaluation and management service by telephone and the availability of in person appointments. I also discussed with the patient that there may be a patient responsible charge related to this service. The patient expressed understanding and agreed to proceed, consent obtained  Chief complaint: Depression, hemorrhoid, bump under chin.   History of Present Illness: Tiffany Solomon is a 33 y.o. female  Depression: Improved on Zoloft when discussed in November.  I recommend meeting with EAP therapist or other therapist to review stress management at that time.  Also we discussed low intensity exercise.   Doing good. Zoloft has helped deal with environmental stressors. Counseling with pastor, but not other counseling - still meeting with pastor. Started exercising.  No side effects on zoloft.  Depression screen [facility] 2/9 12/10/2018 10/04/2018 06/01/2018 04/20/2018  Decreased Interest 0 0 0 0  Down, Depressed, Hopeless - 0 2 2  PHQ - 2 Score 0 0 2 2  Altered sleeping - - 0 0  Tired, decreased energy - - 3 3  Change in appetite - - 0 3  Feeling bad or failure about yourself  - - 1 2  Trouble concentrating - - 0 0  Moving slowly or fidgety/restless - - 0 0  Suicidal thoughts - - 0 0  PHQ-9 Score - - 6 10  Difficult doing work/chores - - Not difficult at all Somewhat difficult    Hemorrhoids: See previous discussion, had experienced approximately 1 year of hemorrhoid issues when discussed last September.  Nonthrombosed hemorrhoid was seen on exam previously.  Treated with Anusol HC, lidocaine cream, and discussed bowel regimen with Colace and MiraLAX given prior constipation issues.  In November she was using MiraLAX about once a  week.  She was referred to a colorectal surgeon in November of last year.  Using Colace every other day, but still having external hemorrhoid issues. BM most days per week. No diarrhea. Feels pain on inside prior to BM, then sore to have BM. Has used lidocaine cream before BM, but still sore, then blood. Also uses anusol HC.  Still some hard stools at times, even with change in diet. More soft stools though and still some bleeding.  Did not meet with surgeon last year   No unexplained wt loss, fever, abdominal pain, or night sweats.   Bump under her chin: Knot/lump under chin started about 2 weeks ago. Middle - under chin. Possible ingrown hair? Oval/firm. No discharge. Did have cold sore at corner of lip when this first started knot still there, cold sore has resolved. No dental issues. Initially sore, not now. Reducing in size. No surrounding redness/warmth.    Patient Active Problem List   Diagnosis Date Noted  . Preeclampsia in postpartum period 05/25/2016  . Pre-eclampsia in postpartum period 05/25/2016  . S/P cesarean section 05/15/2016  . Placental abruption 05/15/2016   Past Medical History:  Diagnosis Date  . Anemia   . Anxiety   . Blood transfusion without reported diagnosis   . Depression   . Dyspnea    with anxiety during pregnancy, no current issues  . Fibroid   . Headache    Migraines  . History of placenta abruption   . Hypertension    postpartum  pre eclampsia, was re-hospitalize  . Miscarriage   . Oral abscess   . PVC's (premature ventricular contractions)    a. occ PVC seen on holter monitor 03/2016. During pregnancy  . S/P cesarean section 08/27/2014  . Sinus tachycardia    during pregnancy  . Traumatic injury during pregnancy, antepartum    Past Surgical History:  Procedure Laterality Date  . CESAREAN SECTION N/A 08/27/2014   Procedure: CESAREAN SECTION;  Surgeon: Maeola Sarah. Landry Mellow, MD;  Location: [facility];  Service: Obstetrics;  Laterality: N/A;  . CESAREAN  SECTION MULTI-GESTATIONAL  05/14/2016   Procedure: CESAREAN SECTION MULTI-GESTATIONAL;  Surgeon: Christophe Louis, MD;  Location: [facility];  Service: Obstetrics;;  . DILATION AND CURETTAGE OF UTERUS    . LAPAROSCOPIC TUBAL LIGATION Bilateral 07/08/2016   Procedure: LAPAROSCOPIC TUBAL LIGATION;  Surgeon: Christophe Louis, MD;  Location: [facility];  Service: Gynecology;  Laterality: Bilateral;  . MYOMECTOMY    . ROOT CANAL    . WISDOM TOOTH EXTRACTION     No Known Allergies Prior to Admission medications   Medication Sig Start Date End Date Taking? Authorizing Provider  Lidocaine 5 % CREA Apply 1 application topically 2 (two) times daily as needed (TO PERIANAL SKIN AS NEEDED.). 04/20/18  Yes Wendie Agreste, MD  PROCTOZONE-HC 2.5 % rectal cream PLACE ONE APPLICATION RECTALLY TWO TIMES DAILY 10/15/18  Yes Wendie Agreste, MD  sertraline (ZOLOFT) 50 MG tablet Take 1 tablet (50 mg total) by mouth daily. 06/01/18  Yes Wendie Agreste, MD   Social History   Socioeconomic History  . Marital status: Single    Spouse name: Not on file  . Number of children: Not on file  . Years of education: Not on file  . Highest education level: Not on file  Occupational History  . Not on file  Social Needs  . Financial resource strain: Not on file  . Food insecurity:    Worry: Not on file    Inability: Not on file  . Transportation needs:    Medical: Not on file    Non-medical: Not on file  Tobacco Use  . Smoking status: Never Smoker  . Smokeless tobacco: Never Used  Substance and Sexual Activity  . Alcohol use: No  . Drug use: No  . Sexual activity: Yes    Comment: having tubes tide  Lifestyle  . Physical activity:    Days per week: Not on file    Minutes per session: Not on file  . Stress: Not on file  Relationships  . Social connections:    Talks on phone: Not on file    Gets together: Not on file    Attends religious service: Not on file    Active member of club or organization: Not on  file    Attends meetings of clubs or organizations: Not on file    Relationship status: Not on file  . Intimate partner violence:    Fear of current or ex partner: Not on file    Emotionally abused: Not on file    Physically abused: Not on file    Forced sexual activity: Not on file  Other Topics Concern  . Not on file  Social History Narrative  . Not on file     Observations/Objective: No distress, euthymic mood. Normal speech.   Assessment and Plan: Hemorrhoids, unspecified hemorrhoid type - Plan: Ambulatory referral to General Surgery Rectal bleeding - Plan: Ambulatory referral to General Surgery  -Recurrent hemorrhoids in the  past.  Still some episodic constipation which likely is contributing, but with continued symptoms, bleeding, and discomfort, will have her evaluated by general surgery.  This was planned last November, but was not seen.  Repeat referral placed, Colace discussed daily, decrease if diarrhea.  RTC precautions if acute worsening.  Consider IBS-C if persistent constipation.   Bumps on skin  -Bump on her chin, based on description could be reactive lymphadenopathy from cold sore.  Reports improving symptoms, no discharge/erythema.  Plan in office visit in the next week to 10 days if not resolving, sooner if worse.  Depression, unspecified depression type - Plan: sertraline (ZOLOFT) 50 MG tablet  -Stable, continue same dose Zoloft.  Commended on exercise and meeting with counseling with pastor.   Follow Up Instructions:  7-10 days for chin bump if not improved.    Patient Instructions    For hemorrhoids and bleeding, I will refer you to a colorectal surgeon to evaluate that area further.  However if any constipation, that can flare hemorrhoids and can be the cause of bleeding.  I would recommend trying Colace daily, but if that causes watery stools or diarrhea then cut back to every other day as you are doing.  Continue to make sure to drink plenty of fluids  throughout the day and fiber in the diet.  If you continue to have issues with constipation, or alternating bowels with constipation and diarrhea, please schedule another visit so we can discuss other possible causes.   Here is the number for the surgery office. If you do not hear from them in next 2 weeks, please call: [phone]  I'm glad to hear the Zoloft has been doing well as well as counseling with your pastor.  I will keep the dose the same.  Let me know if the symptoms change.  Bump under the chin could certainly be a reactive lymph node from the cold sore.  If that does not continue to improve/resolve in the next week to 10 days, I do want to see you to evaluate that further.  If any worsening during that time, please be seen sooner.  Thank you for taking my call today.  Let me know if there are any questions.  Take care.   Return to the clinic or go to the nearest emergency room if any of your symptoms worsen or new symptoms occur.    If you have lab work done today you will be contacted with your lab results within the next 2 weeks.  If you have not heard from [place] then please contact us. The fastest way to get your results is to register for My Chart.   IF you received an x-ray today, you will receive an invoice from [facility]. Please contact [facility] at [phone] with questions or concerns regarding your invoice.   IF you received labwork today, you will receive an invoice from [facility]. Please contact [facility] at [phone] with questions or concerns regarding your invoice.   Our billing staff will not be able to assist you with questions regarding bills from these companies.  You will be contacted with the lab results as soon as they are available. The fastest way to get your results is to activate your My Chart account. Instructions are located on the last page of this paperwork. If you have not heard from [place] regarding the results in 2 weeks, please  contact this office.        I discussed the assessment and treatment  plan with the patient. The patient was provided an opportunity to ask questions and all were answered. The patient agreed with the plan and demonstrated an understanding of the instructions.   The patient was advised to call back or seek an in-person evaluation if the symptoms worsen or if the condition fails to improve as anticipated.  I provided 11 minutes of non-face-to-face time during this encounter.  Signed,   Merri Ray, MD [facility] [place].  12/10/18

## 2018-12-10 NOTE — Patient Instructions (Addendum)
  For hemorrhoids and bleeding, I will refer you to a colorectal surgeon to evaluate that area further.  However if any constipation, that can flare hemorrhoids and can be the cause of bleeding.  I would recommend trying Colace daily, but if that causes watery stools or diarrhea then cut back to every other day as you are doing.  Continue to make sure to drink plenty of fluids throughout the day and fiber in the diet.  If you continue to have issues with constipation, or alternating bowels with constipation and diarrhea, please schedule another visit so we can discuss other possible causes.   Here is the number for the surgery office. If you do not hear from them in next 2 weeks, please call: [phone]  I'm glad to hear the Zoloft has been doing well as well as counseling with your pastor.  I will keep the dose the same.  Let me know if the symptoms change.  Bump under the chin could certainly be a reactive lymph node from the cold sore.  If that does not continue to improve/resolve in the next week to 10 days, I do want to see you to evaluate that further.  If any worsening during that time, please be seen sooner.  Thank you for taking my call today.  Let me know if there are any questions.  Take care.   Return to the clinic or go to the nearest emergency room if any of your symptoms worsen or new symptoms occur.    If you have lab work done today you will be contacted with your lab results within the next 2 weeks.  If you have not heard from [place] then please contact us. The fastest way to get your results is to register for My Chart.   IF you received an x-ray today, you will receive an invoice from [facility]. Please contact [facility] at [phone] with questions or concerns regarding your invoice.   IF you received labwork today, you will receive an invoice from [place]. Please contact [facility] at [phone] with questions or concerns regarding your invoice.   Our  billing staff will not be able to assist you with questions regarding bills from these companies.  You will be contacted with the lab results as soon as they are available. The fastest way to get your results is to activate your My Chart account. Instructions are located on the last page of this paperwork. If you have not heard from [place] regarding the results in 2 weeks, please contact this office.

## 2018-12-10 NOTE — Progress Notes (Signed)
CC-#1Depression- haven't had any feels of wanted to harm herself. The medication is helping to stabilize her       # 2 Need to referred to a specialist for the external/Internal Hemorid       #3 Lump under chin for 2 weeks Think its a ingrown hair and want to talk to you about it. Patient was informed that you may or may not get to all these problems today. Patient then stated she was told this was a f/u for all            the problem. I do not see where this was discussed. Patient was informed she will need an office visit so you can look at the lump under chin.

## 2019-05-27 ENCOUNTER — Ambulatory Visit (INDEPENDENT_AMBULATORY_CARE_PROVIDER_SITE_OTHER): Payer: 59 | Admitting: Family Medicine

## 2019-05-27 ENCOUNTER — Other Ambulatory Visit: Payer: Self-pay

## 2019-05-27 ENCOUNTER — Encounter: Payer: Self-pay | Admitting: Family Medicine

## 2019-05-27 VITALS — BP 115/65 | HR 76 | Temp 98.3°F | Wt 251.6 lb

## 2019-05-27 DIAGNOSIS — R590 Localized enlarged lymph nodes: Secondary | ICD-10-CM | POA: Diagnosis not present

## 2019-05-27 DIAGNOSIS — M542 Cervicalgia: Secondary | ICD-10-CM | POA: Diagnosis not present

## 2019-05-27 LAB — POCT CBC
Granulocyte percent: 64.4 %G (ref 37–80)
HCT, POC: 36.4 % (ref 29–41)
Hemoglobin: 11.7 g/dL (ref 11–14.6)
Lymph, poc: 1.9 (ref 0.6–3.4)
MCH, POC: 26.4 pg — AB (ref 27–31.2)
MCHC: 32.1 g/dL (ref 31.8–35.4)
MCV: 82.2 fL (ref 76–111)
MID (cbc): 0.3 (ref 0–0.9)
MPV: 7.8 fL (ref 0–99.8)
POC Granulocyte: 3.9 (ref 2–6.9)
POC LYMPH PERCENT: 31 %L (ref 10–50)
POC MID %: 4.6 %M (ref 0–12)
Platelet Count, POC: 258 10*3/uL (ref 142–424)
RBC: 4.42 M/uL (ref 4.04–5.48)
RDW, POC: 16.3 %
WBC: 6 10*3/uL (ref 4.6–10.2)

## 2019-05-27 MED ORDER — AMOXICILLIN 500 MG PO CAPS: 500.0000 mg | ORAL_CAPSULE | Freq: Three times a day (TID) | ORAL | 0 refills | Status: AC

## 2019-05-27 NOTE — Progress Notes (Signed)
Subjective:    Patient ID: Tiffany Solomon, female    DOB: [birth year], 33 y.o.   MRN: [id number]  HPI Tiffany Solomon is a 33 y.o. female Presents today for: Chief Complaint  Patient presents with  . Neck Pain    left side of neck is tender for the past 3 days. Left side of face and neck throbs worse at night.   L neck pain:  Started 3 days ago. In left neck area just below jaw line, then into left face area in front of ear.  More sore past few days, more intense yesterday.  No known injury.  No fever, no sore throat, eating and drinking normally. No dysphagia.  No tooth pain. No recent dental work. No new hot/cold sensitivity.  No ear pain, no d/c from ear, no change in hearing.  Tx: tylenol x 3. Less sore today.  No cat at home or recent cat scratch.    Patient Active Problem List   Diagnosis Date Noted  . Preeclampsia in postpartum period 05/25/2016  . Pre-eclampsia in postpartum period 05/25/2016  . S/P cesarean section 05/15/2016  . Placental abruption 05/15/2016   Past Medical History:  Diagnosis Date  . Anemia   . Anxiety   . Blood transfusion without reported diagnosis   . Depression   . Dyspnea    with anxiety during pregnancy, no current issues  . Fibroid   . Headache    Migraines  . History of placenta abruption   . Hypertension    postpartum pre eclampsia, was re-hospitalize  . Miscarriage   . Oral abscess   . PVC's (premature ventricular contractions)    a. occ PVC seen on holter monitor 03/2016. During pregnancy  . S/P cesarean section 08/27/2014  . Sinus tachycardia    during pregnancy  . Traumatic injury during pregnancy, antepartum    Past Surgical History:  Procedure Laterality Date  . CESAREAN SECTION N/A 08/27/2014   Procedure: CESAREAN SECTION;  Surgeon: Maeola Sarah. Landry Mellow, MD;  Location: [facility];  Service: Obstetrics;  Laterality: N/A;  . CESAREAN SECTION MULTI-GESTATIONAL  05/14/2016   Procedure: CESAREAN SECTION MULTI-GESTATIONAL;   Surgeon: Christophe Louis, MD;  Location: [facility];  Service: Obstetrics;;  . DILATION AND CURETTAGE OF UTERUS    . LAPAROSCOPIC TUBAL LIGATION Bilateral 07/08/2016   Procedure: LAPAROSCOPIC TUBAL LIGATION;  Surgeon: Christophe Louis, MD;  Location: [facility];  Service: Gynecology;  Laterality: Bilateral;  . MYOMECTOMY    . ROOT CANAL    . WISDOM TOOTH EXTRACTION     No Known Allergies Prior to Admission medications   Medication Sig Start Date End Date Taking? Authorizing Provider  Lidocaine 5 % CREA Apply 1 application topically 2 (two) times daily as needed (TO PERIANAL SKIN AS NEEDED.). 04/20/18  Yes Wendie Agreste, MD  PROCTOZONE-HC 2.5 % rectal cream PLACE ONE APPLICATION RECTALLY TWO TIMES DAILY 10/15/18  Yes Wendie Agreste, MD  sertraline (ZOLOFT) 50 MG tablet Take 1 tablet (50 mg total) by mouth daily. 12/10/18  Yes Wendie Agreste, MD   Social History   Socioeconomic History  . Marital status: Single    Spouse name: Not on file  . Number of children: Not on file  . Years of education: Not on file  . Highest education level: Not on file  Occupational History  . Not on file  Social Needs  . Financial resource strain: Not on file  . Food insecurity    Worry: Not  on file    Inability: Not on file  . Transportation needs    Medical: Not on file    Non-medical: Not on file  Tobacco Use  . Smoking status: Never Smoker  . Smokeless tobacco: Never Used  Substance and Sexual Activity  . Alcohol use: No  . Drug use: No  . Sexual activity: Yes    Comment: having tubes tide  Lifestyle  . Physical activity    Days per week: Not on file    Minutes per session: Not on file  . Stress: Not on file  Relationships  . Social Herbalist on phone: Not on file    Gets together: Not on file    Attends religious service: Not on file    Active member of club or organization: Not on file    Attends meetings of clubs or organizations: Not on file    Relationship status: Not on  file  . Intimate partner violence    Fear of current or ex partner: Not on file    Emotionally abused: Not on file    Physically abused: Not on file    Forced sexual activity: Not on file  Other Topics Concern  . Not on file  Social History Narrative  . Not on file    Review of Systems Per HPI.     Objective:   Physical Exam Vitals signs reviewed.  Constitutional:      General: She is not in acute distress.    Appearance: She is well-developed.  HENT:     Head: Normocephalic and atraumatic.     Comments: No facial swelling, no rash, parotid not enlarged or tender.  Minimal tenderness at the submandibular gland on the left, but primarily left cervical lymph node with slight enlargement.  Radiating pain towards the jawline.      Right Ear: Tympanic membrane, ear canal and external ear normal.     Left Ear: Tympanic membrane, ear canal and external ear normal.     Mouth/Throat:     Mouth: Mucous membranes are moist.     Pharynx: Oropharynx is clear. No oropharyngeal exudate or posterior oropharyngeal erythema.     Comments: No appreciable dental caries, no surrounding gum erythema or edema on left.   No posterior OP exudate or vesicles.   No exudate from stensons duct.  Cardiovascular:     Rate and Rhythm: Normal rate.  Pulmonary:     Effort: Pulmonary effort is normal.  Lymphadenopathy:     Head:     Right side of head: No preauricular, posterior auricular or occipital adenopathy.     Left side of head: Submandibular adenopathy present. No preauricular, posterior auricular or occipital adenopathy.     Cervical: Cervical adenopathy (left only - min enlarged, ttp. ) present.     Left cervical: Superficial cervical adenopathy present.  Skin:    General: Skin is warm and dry.  Neurological:     Mental Status: She is alert and oriented to person, place, and time.    Vitals:   05/27/19 1540  BP: 115/65  Pulse: 76  Temp: 98.3 F (36.8 C)  TempSrc: Oral  SpO2: 98%   Weight: 251 lb 9.6 oz (114.1 kg)   Results for orders placed or performed in visit on 05/27/19  POCT CBC  Result Value Ref Range   WBC 6.0 4.6 - 10.2 K/uL   Lymph, poc 1.9 0.6 - 3.4   POC LYMPH PERCENT 31.0 10 -  50 %L   MID (cbc) 0.3 0 - 0.9   POC MID % 4.6 0 - 12 %M   POC Granulocyte 3.9 2 - 6.9   Granulocyte percent 64.4 37 - 80 %G   RBC 4.42 4.04 - 5.48 M/uL   Hemoglobin 11.7 11 - 14.6 g/dL   HCT, POC 36.4 29 - 41 %   MCV 82.2 76 - 111 fL   MCH, POC 26.4 (A) 27 - 31.2 pg   MCHC 32.1 31.8 - 35.4 g/dL   RDW, POC 16.3 %   Platelet Count, POC 258 142 - 424 K/uL   MPV 7.8 0 - 99.8 fL      Assessment & Plan:    Tiffany Solomon is a 33 y.o. female Neck pain - Plan: POCT CBC  Lymphadenopathy, cervical - Plan: POCT CBC, amoxicillin (AMOXIL) 500 MG capsule   -Isolated lymphadenopathy without other apparent symptoms.  Differential also includes sialolithiasis with pain up towards the jawline in front of her ear, but parotid nontender at this time and no swelling.  No known cat exposure or scratches, reassuring CBC.  -Symptomatic care with warm compresses, Tylenol /Motrin as needed, start amoxicillin 5 mg 3 times daily, recheck 3 days.  Sour lozenges to encourage salivation also option.  RTC precautions.   Meds ordered this encounter  Medications  . amoxicillin (AMOXIL) 500 MG capsule    Sig: Take 1 capsule (500 mg total) by mouth 3 (three) times daily.    Dispense:  21 capsule    Refill:  0   Patient Instructions    Blood counts are normal today, and I do not see a site of infection to cause a swollen lymph node.  Sometimes this can be reactive to a viral infection.  However without other symptoms, sialolithiasis or salivary gland blockage can also cause pain and swelling. For now can try sour lozenges, be sure to drink plenty of fluids, warm compresses, over affected area, Tylenol or Advil as needed.  I also wrote for antibiotic amoxicillin 3 times per day with recheck 3 to 4  days, sooner if worse.   Lymphadenopathy  Lymphadenopathy means that your lymph glands are swollen or larger than normal (enlarged). Lymph glands, also called lymph nodes, are collections of tissue that filter bacteria, viruses, and waste from your bloodstream. They are part of your body's disease-fighting system (immune system), which protects your body from germs. There may be different causes of lymphadenopathy, depending on where it is in your body. Some types go away on their own. Lymphadenopathy can occur anywhere that you have lymph glands, including these areas:  Neck (cervical lymphadenopathy).  Chest (mediastinal lymphadenopathy).  Lungs (hilar lymphadenopathy).  Underarms (axillary lymphadenopathy).  Groin (inguinal lymphadenopathy). When your immune system responds to germs, infection-fighting cells and fluid build up in your lymph glands. This causes some swelling and enlargement. If the lymph glands do not go back to normal after you have an infection or disease, your health care provider may do tests. These tests help to monitor your condition and find the reason why the glands are still swollen and enlarged. Follow these instructions at home:  Get plenty of rest.  Take over-the-counter and prescription medicines only as told by your health care provider. Your health care provider may recommend over-the-counter medicines for pain.  If directed, apply heat to swollen lymph glands as often as told by your health care provider. Use the heat source that your health care provider recommends, such as a  moist heat pack or a heating pad. ? Place a towel between your skin and the heat source. ? Leave the heat on for 20-30 minutes. ? Remove the heat if your skin turns bright red. This is especially important if you are unable to feel pain, heat, or cold. You may have a greater risk of getting burned.  Check your affected lymph glands every day for changes. Check other lymph gland  areas as told by your health care provider. Check for changes such as: ? More swelling. ? Sudden increase in size. ? Redness or pain. ? Hardness.  Keep all follow-up visits as told by your health care provider. This is important. Contact a health care provider if you have:  Swelling that gets worse or spreads to other areas.  Problems with breathing.  Lymph glands that: ? Are still swollen after 2 weeks. ? Have suddenly gotten bigger. ? Are red, painful, or hard.  A fever or chills.  Fatigue.  A sore throat.  Pain in your abdomen.  Weight loss.  Night sweats. Get help right away if you have:  Fluid leaking from an enlarged lymph gland.  Severe pain.  Chest pain.  Shortness of breath. Summary  Lymphadenopathy means that your lymph glands are swollen or larger than normal (enlarged).  Lymph glands (also called lymph nodes) are collections of tissue that filter bacteria, viruses, and waste from the bloodstream. They are part of your body's disease-fighting system (immune system).  Lymphadenopathy can occur anywhere that you have lymph glands.  If your enlarged and swollen lymph glands do not go back to normal after you have an infection or disease, your health care provider may do tests to monitor your condition and find the reason why the glands are still swollen and enlarged.  Check your affected lymph glands every day for changes. Check other lymph gland areas as told by your health care provider. This information is not intended to replace advice given to you by your health care provider. Make sure you discuss any questions you have with your health care provider. Document Released: 04/26/2008 Document Revised: 06/30/2017 Document Reviewed: 06/02/2017 Elsevier Patient Education  [employer].   If you have lab work done today you will be contacted with your lab results within the next 2 weeks.  If you have not heard from [place] then please contact us. The  fastest way to get your results is to register for My Chart.   IF you received an x-ray today, you will receive an invoice from [facility]. Please contact [facility] at [phone] with questions or concerns regarding your invoice.   IF you received labwork today, you will receive an invoice from [facility]. Please contact [facility] at [phone] with questions or concerns regarding your invoice.   Our billing staff will not be able to assist you with questions regarding bills from these companies.  You will be contacted with the lab results as soon as they are available. The fastest way to get your results is to activate your My Chart account. Instructions are located on the last page of this paperwork. If you have not heard from [place] regarding the results in 2 weeks, please contact this office.       Signed,   Merri Ray, MD [facility].  05/27/19 5:01 PM

## 2019-05-27 NOTE — Patient Instructions (Addendum)
Blood counts are normal today, and I do not see a site of infection to cause a swollen lymph node.  Sometimes this can be reactive to a viral infection.  However without other symptoms, sialolithiasis or salivary gland blockage can also cause pain and swelling. For now can try sour lozenges, be sure to drink plenty of fluids, warm compresses, over affected area, Tylenol or Advil as needed.  I also wrote for antibiotic amoxicillin 3 times per day with recheck 3 to 4 days, sooner if worse.   Lymphadenopathy  Lymphadenopathy means that your lymph glands are swollen or larger than normal (enlarged). Lymph glands, also called lymph nodes, are collections of tissue that filter bacteria, viruses, and waste from your bloodstream. They are part of your body's disease-fighting system (immune system), which protects your body from germs. There may be different causes of lymphadenopathy, depending on where it is in your body. Some types go away on their own. Lymphadenopathy can occur anywhere that you have lymph glands, including these areas:  Neck (cervical lymphadenopathy).  Chest (mediastinal lymphadenopathy).  Lungs (hilar lymphadenopathy).  Underarms (axillary lymphadenopathy).  Groin (inguinal lymphadenopathy). When your immune system responds to germs, infection-fighting cells and fluid build up in your lymph glands. This causes some swelling and enlargement. If the lymph glands do not go back to normal after you have an infection or disease, your health care provider may do tests. These tests help to monitor your condition and find the reason why the glands are still swollen and enlarged. Follow these instructions at home:  Get plenty of rest.  Take over-the-counter and prescription medicines only as told by your health care provider. Your health care provider may recommend over-the-counter medicines for pain.  If directed, apply heat to swollen lymph glands as often as told by your health  care provider. Use the heat source that your health care provider recommends, such as a moist heat pack or a heating pad. ? Place a towel between your skin and the heat source. ? Leave the heat on for 20-30 minutes. ? Remove the heat if your skin turns bright red. This is especially important if you are unable to feel pain, heat, or cold. You may have a greater risk of getting burned.  Check your affected lymph glands every day for changes. Check other lymph gland areas as told by your health care provider. Check for changes such as: ? More swelling. ? Sudden increase in size. ? Redness or pain. ? Hardness.  Keep all follow-up visits as told by your health care provider. This is important. Contact a health care provider if you have:  Swelling that gets worse or spreads to other areas.  Problems with breathing.  Lymph glands that: ? Are still swollen after 2 weeks. ? Have suddenly gotten bigger. ? Are red, painful, or hard.  A fever or chills.  Fatigue.  A sore throat.  Pain in your abdomen.  Weight loss.  Night sweats. Get help right away if you have:  Fluid leaking from an enlarged lymph gland.  Severe pain.  Chest pain.  Shortness of breath. Summary  Lymphadenopathy means that your lymph glands are swollen or larger than normal (enlarged).  Lymph glands (also called lymph nodes) are collections of tissue that filter bacteria, viruses, and waste from the bloodstream. They are part of your body's disease-fighting system (immune system).  Lymphadenopathy can occur anywhere that you have lymph glands.  If your enlarged and swollen lymph glands do not go back  to normal after you have an infection or disease, your health care provider may do tests to monitor your condition and find the reason why the glands are still swollen and enlarged.  Check your affected lymph glands every day for changes. Check other lymph gland areas as told by your health care provider. This  information is not intended to replace advice given to you by your health care provider. Make sure you discuss any questions you have with your health care provider. Document Released: 04/26/2008 Document Revised: 06/30/2017 Document Reviewed: 06/02/2017 Elsevier Patient Education  [employer].   If you have lab work done today you will be contacted with your lab results within the next 2 weeks.  If you have not heard from [place] then please contact us. The fastest way to get your results is to register for My Chart.   IF you received an x-ray today, you will receive an invoice from [facility]. Please contact [facility] at [phone] with questions or concerns regarding your invoice.   IF you received labwork today, you will receive an invoice from [facility]. Please contact [facility] at [phone] with questions or concerns regarding your invoice.   Our billing staff will not be able to assist you with questions regarding bills from these companies.  You will be contacted with the lab results as soon as they are available. The fastest way to get your results is to activate your My Chart account. Instructions are located on the last page of this paperwork. If you have not heard from [place] regarding the results in 2 weeks, please contact this office.

## 2019-05-30 ENCOUNTER — Ambulatory Visit: Payer: 59 | Admitting: Family Medicine

## 2019-05-30 ENCOUNTER — Other Ambulatory Visit: Payer: Self-pay

## 2019-05-30 ENCOUNTER — Encounter: Payer: Self-pay | Admitting: Family Medicine

## 2019-05-30 VITALS — HR 77 | Temp 98.3°F | Resp 18

## 2019-05-30 DIAGNOSIS — M542 Cervicalgia: Secondary | ICD-10-CM

## 2019-05-30 NOTE — Progress Notes (Signed)
Subjective:    Patient ID: Tiffany Solomon, female    DOB: [birth year], 33 y.o.   MRN: [id number]  HPI Tiffany Solomon is a 33 y.o. female Presents today for: Chief Complaint  Patient presents with  . Neck Pain    No pain as of today.  Still feels knot but it may be smaller. No fever/chills.    Suspected isolated lymphadenopathy left neck office visit 3 days ago versus sialoadenitis/sialolithiasis.  Start amoxicillin 500 mg 3 tim es daily, warm compresses, symptomatic care, sour lozenges.  No side effects with abx.  Initial pain worse few nights ago, then improved.  Soreness getting better. Pain last night, but not yet today.  Ate some lemon last night.  No new swelling. No further soreness in front of ear.  No warm compresses used.  No ear pain or change in hearing.    Patient Active Problem List   Diagnosis Date Noted  . Preeclampsia in postpartum period 05/25/2016  . Pre-eclampsia in postpartum period 05/25/2016  . S/P cesarean section 05/15/2016  . Placental abruption 05/15/2016   Past Medical History:  Diagnosis Date  . Anemia   . Anxiety   . Blood transfusion without reported diagnosis   . Depression   . Dyspnea    with anxiety during pregnancy, no current issues  . Fibroid   . Headache    Migraines  . History of placenta abruption   . Hypertension    postpartum pre eclampsia, was re-hospitalize  . Miscarriage   . Oral abscess   . PVC's (premature ventricular contractions)    a. occ PVC seen on holter monitor 03/2016. During pregnancy  . S/P cesarean section 08/27/2014  . Sinus tachycardia    during pregnancy  . Traumatic injury during pregnancy, antepartum    Past Surgical History:  Procedure Laterality Date  . CESAREAN SECTION N/A 08/27/2014   Procedure: CESAREAN SECTION;  Surgeon: Maeola Sarah. Landry Mellow, MD;  Location: [facility];  Service: Obstetrics;  Laterality: N/A;  . CESAREAN SECTION MULTI-GESTATIONAL  05/14/2016   Procedure: CESAREAN SECTION  MULTI-GESTATIONAL;  Surgeon: Christophe Louis, MD;  Location: [facility];  Service: Obstetrics;;  . DILATION AND CURETTAGE OF UTERUS    . LAPAROSCOPIC TUBAL LIGATION Bilateral 07/08/2016   Procedure: LAPAROSCOPIC TUBAL LIGATION;  Surgeon: Christophe Louis, MD;  Location: [facility];  Service: Gynecology;  Laterality: Bilateral;  . MYOMECTOMY    . ROOT CANAL    . WISDOM TOOTH EXTRACTION     No Known Allergies Prior to Admission medications   Medication Sig Start Date End Date Taking? Authorizing Provider  amoxicillin (AMOXIL) 500 MG capsule Take 1 capsule (500 mg total) by mouth 3 (three) times daily. 05/27/19   Wendie Agreste, MD  Lidocaine 5 % CREA Apply 1 application topically 2 (two) times daily as needed (TO PERIANAL SKIN AS NEEDED.). 04/20/18   Wendie Agreste, MD  PROCTOZONE-HC 2.5 % rectal cream PLACE ONE APPLICATION RECTALLY TWO TIMES DAILY 10/15/18   Wendie Agreste, MD  sertraline (ZOLOFT) 50 MG tablet Take 1 tablet (50 mg total) by mouth daily. 12/10/18   Wendie Agreste, MD   Social History   Socioeconomic History  . Marital status: Single    Spouse name: Not on file  . Number of children: Not on file  . Years of education: Not on file  . Highest education level: Not on file  Occupational History  . Not on file  Social Needs  . Emergency planning/management officer  strain: Not on file  . Food insecurity    Worry: Not on file    Inability: Not on file  . Transportation needs    Medical: Not on file    Non-medical: Not on file  Tobacco Use  . Smoking status: Never Smoker  . Smokeless tobacco: Never Used  Substance and Sexual Activity  . Alcohol use: No  . Drug use: No  . Sexual activity: Yes    Comment: having tubes tide  Lifestyle  . Physical activity    Days per week: Not on file    Minutes per session: Not on file  . Stress: Not on file  Relationships  . Social Herbalist on phone: Not on file    Gets together: Not on file    Attends religious service: Not on file     Active member of club or organization: Not on file    Attends meetings of clubs or organizations: Not on file    Relationship status: Not on file  . Intimate partner violence    Fear of current or ex partner: Not on file    Emotionally abused: Not on file    Physically abused: Not on file    Forced sexual activity: Not on file  Other Topics Concern  . Not on file  Social History Narrative  . Not on file    Review of Systems Per HPI.     Objective:   Physical Exam Vitals signs reviewed.  Constitutional:      General: She is not in acute distress.    Appearance: She is well-developed.  HENT:     Head: Normocephalic and atraumatic.  Neck:   Cardiovascular:     Rate and Rhythm: Normal rate and regular rhythm.     Pulses: Normal pulses.  Pulmonary:     Effort: Pulmonary effort is normal.     Breath sounds: Normal breath sounds.  Neurological:     Mental Status: She is alert and oriented to person, place, and time.    Vitals:   05/30/19 1613  Pulse: 77  Resp: 18  Temp: 98.3 F (36.8 C)  TempSrc: Oral  SpO2: 98%       Assessment & Plan:  Tiffany Solomon is a 33 y.o. female Neck pain  -Sialadenitis/sialolithiasis versus lymphadenopathy.  Either way has been improving, no appreciable lymphadenopathy on exam, no soft tissue swelling, no parotid enlargement/swelling.  Continue amoxicillin, symptomatic care, RTC precautions given  No orders of the defined types were placed in this encounter.  Patient Instructions        Warm compresses over affected area are okay to use, continue antibiotic until finished.  If pain does not continue to improve or any recurrence of pain after you complete the antibiotic, please return for recheck.  Return to the clinic or go to the nearest emergency room if any of your symptoms worsen or new symptoms occur.   If you have lab work done today you will be contacted with your lab results within the next 2 weeks.  If you have not  heard from [place] then please contact us. The fastest way to get your results is to register for My Chart.   IF you received an x-ray today, you will receive an invoice from [facility]. Please contact [facility] at [phone] with questions or concerns regarding your invoice.   IF you received labwork today, you will receive an invoice from [employer] [facility]. Please contact [facility] at  [phone] with questions or concerns regarding your invoice.   Our billing staff will not be able to assist you with questions regarding bills from these companies.  You will be contacted with the lab results as soon as they are available. The fastest way to get your results is to activate your My Chart account. Instructions are located on the last page of this paperwork. If you have not heard from [place] regarding the results in 2 weeks, please contact this office.       Signed,   Merri Ray, MD [facility].  05/30/19 4:31 PM

## 2019-05-30 NOTE — Patient Instructions (Addendum)
      Warm compresses over affected area are okay to use, continue antibiotic until finished.  If pain does not continue to improve or any recurrence of pain after you complete the antibiotic, please return for recheck.  Return to the clinic or go to the nearest emergency room if any of your symptoms worsen or new symptoms occur.   If you have lab work done today you will be contacted with your lab results within the next 2 weeks.  If you have not heard from [place] then please contact us. The fastest way to get your results is to register for My Chart.   IF you received an x-ray today, you will receive an invoice from [facility]. Please contact [facility] at [phone] with questions or concerns regarding your invoice.   IF you received labwork today, you will receive an invoice from [facility]. Please contact [facility] at [phone] with questions or concerns regarding your invoice.   Our billing staff will not be able to assist you with questions regarding bills from these companies.  You will be contacted with the lab results as soon as they are available. The fastest way to get your results is to activate your My Chart account. Instructions are located on the last page of this paperwork. If you have not heard from [place] regarding the results in 2 weeks, please contact this office.

## 2021-05-07 DIAGNOSIS — Z6839 Body mass index (BMI) 39.0-39.9, adult: Secondary | ICD-10-CM | POA: Diagnosis not present

## 2021-05-07 DIAGNOSIS — Z131 Encounter for screening for diabetes mellitus: Secondary | ICD-10-CM | POA: Diagnosis not present

## 2021-05-07 DIAGNOSIS — Z13220 Encounter for screening for lipoid disorders: Secondary | ICD-10-CM | POA: Diagnosis not present

## 2021-05-07 DIAGNOSIS — N898 Other specified noninflammatory disorders of vagina: Secondary | ICD-10-CM | POA: Diagnosis not present

## 2021-05-07 DIAGNOSIS — Z114 Encounter for screening for human immunodeficiency virus [HIV]: Secondary | ICD-10-CM | POA: Diagnosis not present

## 2021-05-07 DIAGNOSIS — Z113 Encounter for screening for infections with a predominantly sexual mode of transmission: Secondary | ICD-10-CM | POA: Diagnosis not present

## 2021-05-07 DIAGNOSIS — Z1329 Encounter for screening for other suspected endocrine disorder: Secondary | ICD-10-CM | POA: Diagnosis not present

## 2021-05-07 DIAGNOSIS — Z01419 Encounter for gynecological examination (general) (routine) without abnormal findings: Secondary | ICD-10-CM | POA: Diagnosis not present

## 2021-05-07 DIAGNOSIS — N920 Excessive and frequent menstruation with regular cycle: Secondary | ICD-10-CM | POA: Diagnosis not present

## 2021-05-07 DIAGNOSIS — D259 Leiomyoma of uterus, unspecified: Secondary | ICD-10-CM | POA: Diagnosis not present

## 2021-05-07 DIAGNOSIS — Z0000 Encounter for general adult medical examination without abnormal findings: Secondary | ICD-10-CM | POA: Diagnosis not present

## 2021-05-07 DIAGNOSIS — Z1159 Encounter for screening for other viral diseases: Secondary | ICD-10-CM | POA: Diagnosis not present

## 2021-05-14 DIAGNOSIS — A5901 Trichomonal vulvovaginitis: Secondary | ICD-10-CM | POA: Diagnosis not present

## 2021-05-14 DIAGNOSIS — D259 Leiomyoma of uterus, unspecified: Secondary | ICD-10-CM | POA: Diagnosis not present

## 2021-05-14 DIAGNOSIS — Z118 Encounter for screening for other infectious and parasitic diseases: Secondary | ICD-10-CM | POA: Diagnosis not present

## 2021-05-14 DIAGNOSIS — N898 Other specified noninflammatory disorders of vagina: Secondary | ICD-10-CM | POA: Diagnosis not present

## 2021-05-14 DIAGNOSIS — Z124 Encounter for screening for malignant neoplasm of cervix: Secondary | ICD-10-CM | POA: Diagnosis not present

## 2021-05-18 DIAGNOSIS — Z124 Encounter for screening for malignant neoplasm of cervix: Secondary | ICD-10-CM | POA: Diagnosis not present

## 2021-11-06 DIAGNOSIS — N898 Other specified noninflammatory disorders of vagina: Secondary | ICD-10-CM | POA: Diagnosis not present

## 2021-11-06 DIAGNOSIS — G43909 Migraine, unspecified, not intractable, without status migrainosus: Secondary | ICD-10-CM | POA: Diagnosis not present

## 2021-11-06 DIAGNOSIS — G9332 Myalgic encephalomyelitis/chronic fatigue syndrome: Secondary | ICD-10-CM | POA: Diagnosis not present

## 2021-11-06 DIAGNOSIS — E559 Vitamin D deficiency, unspecified: Secondary | ICD-10-CM | POA: Diagnosis not present

## 2021-11-09 DIAGNOSIS — Z0001 Encounter for general adult medical examination with abnormal findings: Secondary | ICD-10-CM | POA: Diagnosis not present

## 2021-11-09 DIAGNOSIS — R110 Nausea: Secondary | ICD-10-CM | POA: Diagnosis not present

## 2021-11-09 DIAGNOSIS — Z136 Encounter for screening for cardiovascular disorders: Secondary | ICD-10-CM | POA: Diagnosis not present

## 2021-11-09 DIAGNOSIS — Z01419 Encounter for gynecological examination (general) (routine) without abnormal findings: Secondary | ICD-10-CM | POA: Diagnosis not present

## 2021-11-09 DIAGNOSIS — Z113 Encounter for screening for infections with a predominantly sexual mode of transmission: Secondary | ICD-10-CM | POA: Diagnosis not present

## 2021-11-09 DIAGNOSIS — A6000 Herpesviral infection of urogenital system, unspecified: Secondary | ICD-10-CM | POA: Diagnosis not present

## 2021-11-09 DIAGNOSIS — N941 Unspecified dyspareunia: Secondary | ICD-10-CM | POA: Diagnosis not present

## 2021-11-09 DIAGNOSIS — G43909 Migraine, unspecified, not intractable, without status migrainosus: Secondary | ICD-10-CM | POA: Diagnosis not present

## 2021-11-09 DIAGNOSIS — Z131 Encounter for screening for diabetes mellitus: Secondary | ICD-10-CM | POA: Diagnosis not present

## 2021-11-19 DIAGNOSIS — N898 Other specified noninflammatory disorders of vagina: Secondary | ICD-10-CM | POA: Diagnosis not present

## 2021-11-19 DIAGNOSIS — K644 Residual hemorrhoidal skin tags: Secondary | ICD-10-CM | POA: Diagnosis not present

## 2021-11-19 DIAGNOSIS — Z3200 Encounter for pregnancy test, result unknown: Secondary | ICD-10-CM | POA: Diagnosis not present

## 2021-11-19 DIAGNOSIS — N941 Unspecified dyspareunia: Secondary | ICD-10-CM | POA: Diagnosis not present

## 2021-11-19 DIAGNOSIS — N760 Acute vaginitis: Secondary | ICD-10-CM | POA: Diagnosis not present

## 2021-11-24 DIAGNOSIS — B3731 Acute candidiasis of vulva and vagina: Secondary | ICD-10-CM | POA: Diagnosis not present

## 2021-11-24 DIAGNOSIS — N920 Excessive and frequent menstruation with regular cycle: Secondary | ICD-10-CM | POA: Diagnosis not present

## 2021-11-24 DIAGNOSIS — N898 Other specified noninflammatory disorders of vagina: Secondary | ICD-10-CM | POA: Diagnosis not present

## 2021-12-09 DIAGNOSIS — D509 Iron deficiency anemia, unspecified: Secondary | ICD-10-CM | POA: Diagnosis not present

## 2021-12-09 DIAGNOSIS — E785 Hyperlipidemia, unspecified: Secondary | ICD-10-CM | POA: Diagnosis not present

## 2021-12-09 DIAGNOSIS — R7303 Prediabetes: Secondary | ICD-10-CM | POA: Diagnosis not present

## 2022-01-18 DIAGNOSIS — E785 Hyperlipidemia, unspecified: Secondary | ICD-10-CM | POA: Diagnosis not present

## 2022-01-18 DIAGNOSIS — D509 Iron deficiency anemia, unspecified: Secondary | ICD-10-CM | POA: Diagnosis not present

## 2022-02-22 DIAGNOSIS — Z6838 Body mass index (BMI) 38.0-38.9, adult: Secondary | ICD-10-CM | POA: Diagnosis not present

## 2022-02-22 DIAGNOSIS — Z708 Other sex counseling: Secondary | ICD-10-CM | POA: Diagnosis not present

## 2022-02-22 DIAGNOSIS — B3731 Acute candidiasis of vulva and vagina: Secondary | ICD-10-CM | POA: Diagnosis not present

## 2022-02-23 DIAGNOSIS — Z708 Other sex counseling: Secondary | ICD-10-CM | POA: Diagnosis not present

## 2022-04-05 DIAGNOSIS — Z23 Encounter for immunization: Secondary | ICD-10-CM | POA: Diagnosis not present

## 2022-04-05 DIAGNOSIS — Z124 Encounter for screening for malignant neoplasm of cervix: Secondary | ICD-10-CM | POA: Diagnosis not present

## 2022-04-05 DIAGNOSIS — R7303 Prediabetes: Secondary | ICD-10-CM | POA: Diagnosis not present

## 2022-04-05 DIAGNOSIS — D509 Iron deficiency anemia, unspecified: Secondary | ICD-10-CM | POA: Diagnosis not present

## 2022-04-05 DIAGNOSIS — E785 Hyperlipidemia, unspecified: Secondary | ICD-10-CM | POA: Diagnosis not present

## 2022-04-05 DIAGNOSIS — Z0001 Encounter for general adult medical examination with abnormal findings: Secondary | ICD-10-CM | POA: Diagnosis not present

## 2022-04-18 DIAGNOSIS — K644 Residual hemorrhoidal skin tags: Secondary | ICD-10-CM | POA: Diagnosis not present

## 2022-04-18 DIAGNOSIS — K5900 Constipation, unspecified: Secondary | ICD-10-CM | POA: Diagnosis not present

## 2022-05-19 ENCOUNTER — Encounter: Payer: Self-pay | Admitting: Physician Assistant

## 2022-06-17 NOTE — Progress Notes (Unsigned)
06/21/2022 Tiffany Solomon [id number] March 26, 1986  Referring provider: Wendie Agreste, MD Primary GI doctor: Dr. 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  -We discussed hemorrhoid banding here in the office for internal hemorrhoids if not improving with conservative treatment and after colonoscopy.  We have discussed the risks of bleeding, infection, perforation, medication reactions, and remote risk of death associated with colonoscopy. All questions were answered and the patient acknowledges these risk and wishes to proceed.  Chronic idiopathic constipation No AB pain, bloating, likely CIC, possible pelvic floor component.  Will do linzess 72 mcg, may consider pelvic floor PT after colonoscopy   Patient Care Team: Eben Burow, NP as PCP - General (Family Medicine)  HISTORY OF PRESENT ILLNESS: 36 y.o. female with a past medical history of anemia and others listed below presents for evaluation of hemorrhoids.  Has twin boys that are 39, 7 girl. 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   She states she has had issues with hemorrhoids for years and has always had underlying constipation. .  Was diagnosed with anal fissure years ago and given compound cream that fixed it.  She has had anemia for 5-6 years, most recent HGB 11.7 ( was 10.5 2017), takes iron pills, and 2 months ago she had straining with constipation, she had her hemorrhoid come out for 2-3 weeks and had significant pain.  She has been doing tucks, lidocaine, and anusol creams.  She will take [facility] gentle laxative  and will take this to plan for her Bm's at home.  She had BM Sunday, has not had another, plans on taking laxative today so she have BM today.  No matter if stool is soft or hard will have pain and bleeding. No family history of colon cancer. Maternal uncle pancreatic cancer, mom with brain tumor.  Denies GERD, nausea, vomiting, dysphagia.  No AB pain.   She has menorrhagia and fibroids and suppose to get options. Her cycle will last 6-7 days, will be heavy for 3-4 days and have to wear adult diapers.   She  reports that she has never smoked. She has never used smokeless tobacco. She reports that she does not drink alcohol and does not use drugs.  Current Medications:        Current Outpatient Medications (Other):    hydrocortisone (ANUSOL-HC) 2.5 % rectal cream, Place 1 Application rectally 2 (two) times daily.   hydrocortisone (ANUSOL-HC) 25 MG suppository, Place 1 suppository (25 mg total) rectally 2 (two) times daily.   Lidocaine 5 % CREA, Apply 1 application topically 2 (two) times daily as needed (TO PERIANAL SKIN AS NEEDED.).   linaclotide (LINZESS) 72 MCG capsule, Take 1 capsule (72 mcg total) by mouth daily before breakfast.   Na Sulfate-K Sulfate-Mg Sulf 17.5-3.13-1.6 GM/177ML SOLN, Take 1 kit by mouth once for 1 dose.   amoxicillin (AMOXIL) 500 MG capsule, Take 1 capsule (500 mg total) by mouth 3 (three) times daily. (Patient not taking: Reported on 06/21/2022)   sertraline (ZOLOFT) 50 MG tablet, Take 1 tablet (50 mg total) by mouth daily. (Patient not taking: Reported  on 06/21/2022)  Medical History:  Past Medical History:  Diagnosis Date   Anemia    Anxiety    Blood transfusion without reported diagnosis    Depression    Dyspnea    with anxiety during pregnancy, no current issues   Fibroid    Headache    Migraines   History of placenta abruption    Hypertension    postpartum pre eclampsia, was re-hospitalize   Miscarriage    Oral abscess    PVC's (premature  ventricular contractions)    a. occ PVC seen on holter monitor 03/2016. During pregnancy   S/P cesarean section 08/27/2014   Sinus tachycardia    during pregnancy   Traumatic injury during pregnancy, antepartum    Allergies: No Known Allergies   Surgical History:  She  has a past surgical history that includes Myomectomy; Dilation and curettage of uterus; Cesarean section (N/A, 08/27/2014); Root canal; Wisdom tooth extraction; Laparoscopic tubal ligation (Bilateral, 07/08/2016); and Cesarean section multi-gestational (05/14/2016). Family History:  Her family history includes Brain cancer in her mother; Heart disease in her maternal aunt; Hypertension in her maternal aunt.  REVIEW OF SYSTEMS  : All other systems reviewed and negative except where noted in the History of Present Illness.  PHYSICAL EXAM: BP 118/80   Pulse 93   Ht _0  (1.651 m)   Wt 239 lb 6 oz (108.6 kg)   BMI 39.83 kg/m  General:   Pleasant, well developed female in no acute distress Head:   Normocephalic and atraumatic. Eyes:  sclerae anicteric,conjunctive pink  Heart:   regular rate and rhythm Pulm:  Clear anteriorly; no wheezing Abdomen:   Soft, Obese AB, Active bowel sounds. No tenderness . Without guarding and Without rebound, No organomegaly appreciated. Rectal: Normal external rectal exam with hemorrhoid skin tag, normal rectal tone, appreciated internal hemorrhoids, non-tender, no masses, , brown stool, hemoccult Positive Extremities:  Without edema. Msk: Symmetrical without gross deformities. Peripheral pulses intact.  Neurologic:  Alert and  oriented x4;  No focal deficits.  Skin:   Dry and intact without significant lesions or rashes. Psychiatric:  Cooperative. Normal mood and affect.    Vladimir Crofts, PA-C 9:44 AM

## 2022-06-21 ENCOUNTER — Encounter: Payer: Self-pay | Admitting: Physician Assistant

## 2022-06-21 ENCOUNTER — Telehealth: Payer: Self-pay

## 2022-06-21 ENCOUNTER — Other Ambulatory Visit (HOSPITAL_COMMUNITY): Payer: Self-pay

## 2022-06-21 ENCOUNTER — Ambulatory Visit: Payer: BC Managed Care – PPO | Admitting: Physician Assistant

## 2022-06-21 VITALS — BP 118/80 | HR 93 | Ht 65.0 in | Wt 239.4 lb

## 2022-06-21 DIAGNOSIS — K625 Hemorrhage of anus and rectum: Secondary | ICD-10-CM

## 2022-06-21 DIAGNOSIS — K649 Unspecified hemorrhoids: Secondary | ICD-10-CM

## 2022-06-21 DIAGNOSIS — K5904 Chronic idiopathic constipation: Secondary | ICD-10-CM

## 2022-06-21 DIAGNOSIS — D500 Iron deficiency anemia secondary to blood loss (chronic): Secondary | ICD-10-CM | POA: Diagnosis not present

## 2022-06-21 MED ORDER — HYDROCORTISONE (PERIANAL) 2.5 % EX CREA: 1.0000 | TOPICAL_CREAM | Freq: Two times a day (BID) | CUTANEOUS | 2 refills | Status: AC

## 2022-06-21 MED ORDER — LINACLOTIDE 72 MCG PO CAPS: 72.0000 ug | ORAL_CAPSULE | Freq: Every day | ORAL | 0 refills | Status: AC

## 2022-06-21 MED ORDER — NA SULFATE-K SULFATE-MG SULF 17.5-3.13-1.6 GM/177ML PO SOLN: 1.0000 | Freq: Once | ORAL | 0 refills | Status: AC

## 2022-06-21 MED ORDER — HYDROCORTISONE ACETATE 25 MG RE SUPP: 25.0000 mg | Freq: Two times a day (BID) | RECTAL | 0 refills | Status: AC

## 2022-06-21 NOTE — Patient Instructions (Addendum)
We have sent the following medications to your pharmacy for you to pick up at your convenience: hydrocortisone rectal cream, hydrocortisone suppository, linzess, suprep  We have given you samples of the following medication to take: Linzess 72 mcg  You have been scheduled for a colonoscopy. Please follow written instructions given to you at your visit today.  Please pick up your prep supplies at the pharmacy within the next 1-3 days. If you use inhalers (even only as needed), please bring them with you on the day of your procedure.]   Linzess  *IBS-C patients may begin to experience relief from belly pain and overall abdominal symptoms (pain, discomfort, and bloating) in about 1 week,  with symptoms typically improving over 12 weeks.  Take at least 30 minutes before the first meal of the day on an empty stomach You can have a loose stool if you eat a high-fat breakfast. Give it at least 7 days, may have more bowel movements during that time.   The diarrhea should go away and you should start having normal, complete, full bowel movements.  It may be helpful to start treatment when you can be near the comfort of your own bathroom, such as a weekend.  After you are out we can send in a prescription if you did well, there is a prescription card  Please do sitz baths- these can be found at the pharmacy. It is a English as a second language teacher that is put in your toliet.  Please increase fiber or add benefiber, increase water and increase acitivity.  Will send in hydrocoritsone suppository, cheapest with GOODRX from [employer], [employer], [employer] or [facility] if your insurance does not pay for it. If the hemorrhoid suppository sent in is too expensive you can do this over the counter trick.  Apply a pea size amount of over the counter Anusol HC cream to the tip of an over the counter PrepH suppository and insert rectally once every night for at least 7 nights.  If this does not improve there are procedures that can  be done.   About Hemorrhoids  Hemorrhoids are swollen veins in the lower rectum and anus.  Also called piles, hemorrhoids are a common problem.  Hemorrhoids may be internal (inside the rectum) or external (around the anus).  Internal Hemorrhoids  Internal hemorrhoids are often painless, but they rarely cause bleeding.  The internal veins may stretch and fall down (prolapse) through the anus to the outside of the body.  The veins may then become irritated and painful.  External Hemorrhoids  External hemorrhoids can be easily seen or felt around the anal opening.  They are under the skin around the anus.  When the swollen veins are scratched or broken by straining, rubbing or wiping they sometimes bleed.  How Hemorrhoids Occur  Veins in the rectum and around the anus tend to swell under pressure.  Hemorrhoids can result from increased pressure in the veins of your anus or rectum.  Some sources of pressure are:  Straining to have a bowel movement because of constipation Waiting too long to have a bowel movement Coughing and sneezing often Sitting for extended periods of time, including on the toilet Diarrhea Obesity Trauma or injury to the anus Some liver diseases Stress Family history of hemorrhoids Pregnancy  Pregnant women should try to avoid becoming constipated, because they are more likely to have hemorrhoids during pregnancy.  In the last trimester of pregnancy, the enlarged uterus may press on blood vessels and causes hemorrhoids.  In addition, the strain of childbirth sometimes causes hemorrhoids after the birth.  Symptoms of Hemorrhoids  Some symptoms of hemorrhoids include: Swelling and/or a tender lump around the anus Itching, mild burning and bleeding around the anus Painful bowel movements with or without constipation Bright red blood covering the stool, on toilet paper or in the toilet bowel.   Symptoms usually go away within a few days.  Always talk to your doctor  about any bleeding to make sure it is not from some other causes.  Diagnosing and Treating Hemorrhoids  Diagnosis is made by an examination by your healthcare provider.  Special test can be performed by your doctor.    Most cases of hemorrhoids can be treated with: High-fiber diet: Eat more high-fiber foods, which help prevent constipation.  Ask for more detailed fiber information on types and sources of fiber from your healthcare provider. Fluids: Drink plenty of water.  This helps soften bowel movements so they are easier to pass. Sitz baths and cold packs: Sitting in lukewarm water two or three times a day for 15 minutes cleases the anal area and may relieve discomfort.  If the water is too hot, swelling around the anus will get worse.  Placing a cloth-covered ice pack on the anus for ten minutes four times a day can also help reduce selling.  Gently pushing a prolapsed hemorrhoid back inside after the bath or ice pack can be helpful. Medications: For mild discomfort, your healthcare provider may suggest over-the-counter pain medication or prescribe a cream or ointment for topical use.  The cream may contain witch hazel, zinc oxide or petroleum jelly.  Medicated suppositories are also a treatment option.  Always consult your doctor before applying medications or creams. Procedures and surgeries: There are also a number of procedures and surgeries to shrink or remove hemorrhoids in more serious cases.  Talk to your physician about these options.  You can often prevent hemorrhoids or keep them from becoming worse by maintaining a healthy lifestyle.  Eat a fiber-rich diet of fruits, vegetables and whole grains.  Also, drink plenty of water and exercise regularly.   2007, [employer] Doc.30

## 2022-06-21 NOTE — Telephone Encounter (Signed)
Pharmacy Patient Advocate Encounter   Received notification from [facility] that prior authorization for Linzess 72 MCG is due for renewal.   PA submitted on 06/21/22 Key: [id number] Status is pending

## 2022-06-24 ENCOUNTER — Telehealth (HOSPITAL_COMMUNITY): Payer: Self-pay | Admitting: Pharmacy Technician

## 2022-06-24 NOTE — Telephone Encounter (Signed)
Patient Advocate Encounter  Received notification from [facility] that prior authorization for ANUSOL [facility] '25MG'$  is required.   PA submitted on 11.24.23 Key [id number] Status is pending    Luciano Cutter, CPhT Patient Advocate Phone: [phone]

## 2022-06-24 NOTE — Telephone Encounter (Signed)
Received a fax regarding Prior Authorization from [facility] for [facility]. Authorization has been DENIED because pt must be able to try and fail , or be unable to take ALL formulary alternatives. Pt would need to try the alternative medication Trulance.

## 2022-06-27 ENCOUNTER — Other Ambulatory Visit (HOSPITAL_COMMUNITY): Payer: Self-pay

## 2022-06-27 NOTE — Telephone Encounter (Signed)
Received a fax regarding Prior Authorization from [facility] for ANUCORT [facility] '25MG'$  SUPP. Authorization has been DENIED because:

## 2022-06-27 NOTE — Telephone Encounter (Signed)
Called & made patient aware the linzess was denied by insurance, and that next option would be a trial of Trulance. Samples placed at second floor front desk & she will come by at earliest convenience to pick those up. If she does well, she will call back & we will send in prescription. She also asked to reschedule her colon d/t no transportation on current date. Rescheduled to 08/03/22 at 8:00 am. Updated instructions sent to [facility], she prefers miralax prep d/t insurance denial from previous prep.

## 2022-06-30 DIAGNOSIS — N760 Acute vaginitis: Secondary | ICD-10-CM | POA: Diagnosis not present

## 2022-06-30 DIAGNOSIS — B3731 Acute candidiasis of vulva and vagina: Secondary | ICD-10-CM | POA: Diagnosis not present

## 2022-07-08 ENCOUNTER — Encounter: Payer: BC Managed Care – PPO | Admitting: Gastroenterology

## 2022-07-15 ENCOUNTER — Other Ambulatory Visit (HOSPITAL_COMMUNITY): Payer: Self-pay

## 2022-07-26 ENCOUNTER — Encounter: Payer: Self-pay | Admitting: Gastroenterology

## 2022-08-03 ENCOUNTER — Encounter: Payer: BC Managed Care – PPO | Admitting: Gastroenterology

## 2022-08-12 DIAGNOSIS — B3731 Acute candidiasis of vulva and vagina: Secondary | ICD-10-CM | POA: Diagnosis not present

## 2022-08-18 ENCOUNTER — Other Ambulatory Visit (HOSPITAL_COMMUNITY): Payer: Self-pay

## 2022-08-18 NOTE — Telephone Encounter (Signed)
Called for follow up. Was told that it still showed pending and no other information. Henry Russel is the appeal reviewer [phone]. Left a VM for her. Her greeting msg for her VM did state that due to high call volume appeals were taking the full 30 days. Looks like it's been 30 days, but a determination hasn't been entered yet. Asked that she call me or [place] back.

## 2022-10-13 DIAGNOSIS — D509 Iron deficiency anemia, unspecified: Secondary | ICD-10-CM | POA: Diagnosis not present

## 2022-10-13 DIAGNOSIS — E785 Hyperlipidemia, unspecified: Secondary | ICD-10-CM | POA: Diagnosis not present

## 2022-10-17 DIAGNOSIS — K581 Irritable bowel syndrome with constipation: Secondary | ICD-10-CM | POA: Diagnosis not present

## 2022-10-17 DIAGNOSIS — R7303 Prediabetes: Secondary | ICD-10-CM | POA: Diagnosis not present

## 2022-10-17 DIAGNOSIS — E785 Hyperlipidemia, unspecified: Secondary | ICD-10-CM | POA: Diagnosis not present

## 2022-10-17 DIAGNOSIS — D509 Iron deficiency anemia, unspecified: Secondary | ICD-10-CM | POA: Diagnosis not present

## 2023-01-11 DIAGNOSIS — Z113 Encounter for screening for infections with a predominantly sexual mode of transmission: Secondary | ICD-10-CM | POA: Diagnosis not present

## 2023-01-11 DIAGNOSIS — N898 Other specified noninflammatory disorders of vagina: Secondary | ICD-10-CM | POA: Diagnosis not present

## 2023-06-01 DIAGNOSIS — B3731 Acute candidiasis of vulva and vagina: Secondary | ICD-10-CM | POA: Diagnosis not present

## 2023-07-17 DIAGNOSIS — E785 Hyperlipidemia, unspecified: Secondary | ICD-10-CM | POA: Diagnosis not present

## 2023-07-17 DIAGNOSIS — D509 Iron deficiency anemia, unspecified: Secondary | ICD-10-CM | POA: Diagnosis not present

## 2023-07-17 DIAGNOSIS — Z1231 Encounter for screening mammogram for malignant neoplasm of breast: Secondary | ICD-10-CM | POA: Diagnosis not present

## 2023-07-17 DIAGNOSIS — Z0001 Encounter for general adult medical examination with abnormal findings: Secondary | ICD-10-CM | POA: Diagnosis not present

## 2023-07-17 DIAGNOSIS — Z124 Encounter for screening for malignant neoplasm of cervix: Secondary | ICD-10-CM | POA: Diagnosis not present

## 2023-07-17 DIAGNOSIS — Z23 Encounter for immunization: Secondary | ICD-10-CM | POA: Diagnosis not present

## 2023-07-17 DIAGNOSIS — N390 Urinary tract infection, site not specified: Secondary | ICD-10-CM | POA: Diagnosis not present

## 2023-07-17 DIAGNOSIS — K644 Residual hemorrhoidal skin tags: Secondary | ICD-10-CM | POA: Diagnosis not present

## 2023-07-18 ENCOUNTER — Other Ambulatory Visit: Payer: Self-pay | Admitting: Family Medicine

## 2023-07-18 DIAGNOSIS — Z1231 Encounter for screening mammogram for malignant neoplasm of breast: Secondary | ICD-10-CM

## 2023-08-19 DIAGNOSIS — B3731 Acute candidiasis of vulva and vagina: Secondary | ICD-10-CM | POA: Diagnosis not present

## 2023-10-07 DIAGNOSIS — B3731 Acute candidiasis of vulva and vagina: Secondary | ICD-10-CM | POA: Diagnosis not present

## 2023-10-10 DIAGNOSIS — N898 Other specified noninflammatory disorders of vagina: Secondary | ICD-10-CM | POA: Diagnosis not present

## 2023-10-10 DIAGNOSIS — A6004 Herpesviral vulvovaginitis: Secondary | ICD-10-CM | POA: Diagnosis not present

## 2023-10-10 DIAGNOSIS — N760 Acute vaginitis: Secondary | ICD-10-CM | POA: Diagnosis not present

## 2023-10-10 DIAGNOSIS — Z113 Encounter for screening for infections with a predominantly sexual mode of transmission: Secondary | ICD-10-CM | POA: Diagnosis not present

## 2023-10-10 DIAGNOSIS — N920 Excessive and frequent menstruation with regular cycle: Secondary | ICD-10-CM | POA: Diagnosis not present

## 2024-01-04 DIAGNOSIS — Z124 Encounter for screening for malignant neoplasm of cervix: Secondary | ICD-10-CM | POA: Diagnosis not present

## 2024-01-04 DIAGNOSIS — Z1389 Encounter for screening for other disorder: Secondary | ICD-10-CM | POA: Diagnosis not present

## 2024-01-04 DIAGNOSIS — N920 Excessive and frequent menstruation with regular cycle: Secondary | ICD-10-CM | POA: Diagnosis not present

## 2024-01-04 DIAGNOSIS — R002 Palpitations: Secondary | ICD-10-CM | POA: Diagnosis not present

## 2024-01-04 DIAGNOSIS — Z01411 Encounter for gynecological examination (general) (routine) with abnormal findings: Secondary | ICD-10-CM | POA: Diagnosis not present

## 2024-01-04 DIAGNOSIS — Z862 Personal history of diseases of the blood and blood-forming organs and certain disorders involving the immune mechanism: Secondary | ICD-10-CM | POA: Diagnosis not present

## 2024-02-22 ENCOUNTER — Other Ambulatory Visit: Payer: Self-pay | Admitting: Obstetrics & Gynecology

## 2024-02-22 DIAGNOSIS — N920 Excessive and frequent menstruation with regular cycle: Secondary | ICD-10-CM | POA: Diagnosis not present

## 2024-02-26 LAB — SURGICAL PATHOLOGY

## 2024-04-05 ENCOUNTER — Other Ambulatory Visit: Payer: Self-pay | Admitting: Obstetrics and Gynecology

## 2024-04-05 DIAGNOSIS — N879 Dysplasia of cervix uteri, unspecified: Secondary | ICD-10-CM | POA: Diagnosis not present

## 2024-04-05 DIAGNOSIS — N920 Excessive and frequent menstruation with regular cycle: Secondary | ICD-10-CM | POA: Diagnosis not present

## 2024-04-05 DIAGNOSIS — N939 Abnormal uterine and vaginal bleeding, unspecified: Secondary | ICD-10-CM | POA: Diagnosis not present

## 2024-04-08 LAB — SURGICAL PATHOLOGY

## 2024-04-27 ENCOUNTER — Emergency Department (HOSPITAL_COMMUNITY): Admission: EM | Admit: 2024-04-27 | Discharge: 2024-04-27 | Disposition: A | Discharge status: Home / Self Care

## 2024-04-27 ENCOUNTER — Encounter (HOSPITAL_COMMUNITY): Payer: Self-pay | Admitting: Pharmacy Technician

## 2024-04-27 ENCOUNTER — Other Ambulatory Visit: Payer: Self-pay

## 2024-04-27 DIAGNOSIS — Y9301 Activity, walking, marching and hiking: Secondary | ICD-10-CM | POA: Insufficient documentation

## 2024-04-27 DIAGNOSIS — M79604 Pain in right leg: Secondary | ICD-10-CM

## 2024-04-27 DIAGNOSIS — X58XXXA Exposure to other specified factors, initial encounter: Secondary | ICD-10-CM | POA: Insufficient documentation

## 2024-04-27 DIAGNOSIS — M79661 Pain in right lower leg: Secondary | ICD-10-CM | POA: Diagnosis not present

## 2024-04-27 DIAGNOSIS — S8011XA Contusion of right lower leg, initial encounter: Secondary | ICD-10-CM | POA: Diagnosis not present

## 2024-04-27 DIAGNOSIS — T148XXA Other injury of unspecified body region, initial encounter: Secondary | ICD-10-CM

## 2024-04-27 LAB — URINALYSIS, ROUTINE W REFLEX MICROSCOPIC
Bilirubin Urine: NEGATIVE
Glucose, UA: NEGATIVE mg/dL
Ketones, ur: NEGATIVE mg/dL
Leukocytes,Ua: NEGATIVE
Nitrite: NEGATIVE
Protein, ur: NEGATIVE mg/dL
Specific Gravity, Urine: 1.024 (ref 1.005–1.030)
pH: 6 (ref 5.0–8.0)

## 2024-04-27 LAB — CBC WITH DIFFERENTIAL/PLATELET
Abs Immature Granulocytes: 0.03 K/uL (ref 0.00–0.07)
Basophils Absolute: 0 K/uL (ref 0.0–0.1)
Basophils Relative: 0 %
Eosinophils Absolute: 0.1 K/uL (ref 0.0–0.5)
Eosinophils Relative: 1 %
HCT: 34.2 % — ABNORMAL LOW (ref 36.0–46.0)
Hemoglobin: 10.6 g/dL — ABNORMAL LOW (ref 12.0–15.0)
Immature Granulocytes: 0 %
Lymphocytes Relative: 32 %
Lymphs Abs: 2.4 K/uL (ref 0.7–4.0)
MCH: 26.6 pg (ref 26.0–34.0)
MCHC: 31 g/dL (ref 30.0–36.0)
MCV: 85.7 fL (ref 80.0–100.0)
Monocytes Absolute: 0.4 K/uL (ref 0.1–1.0)
Monocytes Relative: 6 %
Neutro Abs: 4.7 K/uL (ref 1.7–7.7)
Neutrophils Relative %: 61 %
Platelets: 295 K/uL (ref 150–400)
RBC: 3.99 MIL/uL (ref 3.87–5.11)
RDW: 14.2 % (ref 11.5–15.5)
WBC: 7.7 K/uL (ref 4.0–10.5)
nRBC: 0 % (ref 0.0–0.2)

## 2024-04-27 LAB — BASIC METABOLIC PANEL WITH GFR
Anion gap: 8 (ref 5–15)
BUN: 6 mg/dL (ref 6–20)
CO2: 27 mmol/L (ref 22–32)
Calcium: 9.1 mg/dL (ref 8.9–10.3)
Chloride: 103 mmol/L (ref 98–111)
Creatinine, Ser: 0.96 mg/dL (ref 0.44–1.00)
GFR, Estimated: >60 mL/min (ref >60)
Glucose, Bld: 124 mg/dL — ABNORMAL HIGH (ref 70–99)
Potassium: 3.7 mmol/L (ref 3.5–5.1)
Sodium: 138 mmol/L (ref 135–145)

## 2024-04-27 LAB — PROTIME-INR
INR: 1 (ref 0.8–1.2)
Prothrombin Time: 13.5 s (ref 11.4–15.2)

## 2024-04-27 LAB — D-DIMER, QUANTITATIVE: D-Dimer, Quant: <0.27 ug{FEU}/mL (ref 0.00–0.50)

## 2024-04-27 NOTE — ED Notes (Signed)
 Pt has been instructed to come to the ER registration desk tmrw at 11am to get her US  for DVT r/o.

## 2024-04-27 NOTE — Discharge Instructions (Addendum)
 Please show up tomorrow at the front desk for the DVT ultrasound.  Your D-dimer was not unremarkable.  Therefore, I feel like this is not likely a DVT that we should still rule this out.  Could just be a muscle strain.  If this DVT ultrasound is tomorrow there is take it easy over the next couple days to see if this resolves.  You could have pulled something whenever you were walking     IMPORTANT PATIENT INSTRUCTIONS: You have been scheduled for an Outpatient Vascular Study at [facility].  If tomorrow is a Saturday, Sunday or holiday, please go to the [facility] Emergency Department Registration Desk at 11 am tomorrow morning and tell them you are there for a vascular study.  If tomorrow is a weekday (Monday-Friday), please go to the [facility] (address [address], [place]) at 8 am and report to the 4th floor registration Zone A.  Inform registration that you are there for a vascular study.

## 2024-04-27 NOTE — ED Provider Notes (Signed)
 [facility] AT [facility] Provider Note   CSN: [id number] Arrival date & time: 04/27/24  1831     Patient presents with: Leg Pain   Tiffany Solomon is a 38 y.o. female.    Leg Pain Associated symptoms: no back pain and no fever     Presents because of calf pain and bruising.  Patient states that on Monday she started walking again for the first time in some time.  She states that she walked around the track a couple times.  On Tuesday she noticed that she has some calf pain and cramping on the right ower calf.  Patient states that she thought she was just sore.  Subsequently noticed yesterday that she had some bruising to the back of the calf.  Came to the ED for further evaluation.  No history of DVT or PE.  No recent travel history.  No cancer history.  No hormone replacement.  No recent surgical procedures.  No chest pain or shortness of breath.  No pleuritic chest pain.  No hemoptysis.     Prior to Admission medications   Medication Sig Start Date End Date Taking? Authorizing Provider  amoxicillin  (AMOXIL ) 500 MG capsule Take 1 capsule (500 mg total) by mouth 3 (three) times daily. Patient not taking: Reported on 06/21/2022 05/27/19   Levora Reyes SAUNDERS, MD  hydrocortisone  (ANUSOL -HC) 2.5 % rectal cream Place 1 Application rectally 2 (two) times daily. 06/21/22   Craig Alan SAUNDERS, PA-C  hydrocortisone  (ANUSOL -HC) 25 MG suppository Place 1 suppository (25 mg total) rectally 2 (two) times daily. 06/21/22   Craig Alan SAUNDERS, PA-C  Lidocaine  5 % CREA Apply 1 application topically 2 (two) times daily as needed (TO PERIANAL SKIN AS NEEDED.). 04/20/18   Levora Reyes SAUNDERS, MD  linaclotide  (LINZESS ) 72 MCG capsule Take 1 capsule (72 mcg total) by mouth daily before breakfast. 06/21/22 06/16/23  Craig Alan SAUNDERS, PA-C  sertraline  (ZOLOFT ) 50 MG tablet Take 1 tablet (50 mg total) by mouth daily. Patient not taking: Reported on 06/21/2022 12/10/18   Levora Reyes SAUNDERS, MD    Allergies: Patient has no known allergies.    Review of Systems  Constitutional:  Negative for chills and fever.  HENT:  Negative for ear pain and sore throat.   Eyes:  Negative for pain and visual disturbance.  Respiratory:  Negative for cough and shortness of breath.   Cardiovascular:  Negative for chest pain and palpitations.  Gastrointestinal:  Negative for abdominal pain and vomiting.  Genitourinary:  Negative for dysuria and hematuria.  Musculoskeletal:  Negative for arthralgias and back pain.  Skin:  Negative for color change and rash.  Neurological:  Negative for seizures and syncope.  All other systems reviewed and are negative.   Updated Vital Signs BP 131/87   Pulse 90   Temp 98 F (36.7 C)   Resp 18   SpO2 100%   Physical Exam Vitals and nursing note reviewed.  Constitutional:      General: She is not in acute distress.    Appearance: She is well-developed.  HENT:     Head: Normocephalic and atraumatic.  Eyes:     Conjunctiva/sclera: Conjunctivae normal.  Cardiovascular:     Rate and Rhythm: Normal rate and regular rhythm.     Heart sounds: No murmur heard. Pulmonary:     Effort: Pulmonary effort is normal. No respiratory distress.     Breath sounds: Normal breath sounds.  Abdominal:  Palpations: Abdomen is soft.     Tenderness: There is no abdominal tenderness.  Musculoskeletal:        General: No swelling.     Cervical back: Neck supple.       Legs:  Skin:    General: Skin is warm and dry.     Capillary Refill: Capillary refill takes less than 2 seconds.  Neurological:     Mental Status: She is alert.  Psychiatric:        Mood and Affect: Mood normal.     (all labs ordered are listed, but only abnormal results are displayed) Labs Reviewed  BASIC METABOLIC PANEL WITH GFR - Abnormal; Notable for the following components:      Result Value   Glucose, Bld 124 (*)    All other components within normal limits  CBC WITH  DIFFERENTIAL/PLATELET - Abnormal; Notable for the following components:   Hemoglobin 10.6 (*)    HCT 34.2 (*)    All other components within normal limits  URINALYSIS, ROUTINE W REFLEX MICROSCOPIC - Abnormal; Notable for the following components:   APPearance HAZY (*)    Hgb urine dipstick SMALL (*)    Bacteria, UA RARE (*)    All other components within normal limits  D-DIMER, QUANTITATIVE  PROTIME-INR    EKG: None  Radiology: No results found.   Procedures   Medications Ordered in the ED - No data to display                                  Medical Decision Making   Presents because of calf pain and bruising.  Patient states that on Monday she started walking again for the first time in some time.  She states that she walked around the track a couple times.  On Tuesday she noticed that she has some calf pain and cramping on the right lower calf.  Patient states that she thought she was just sore.  Subsequently noticed yesterday that she had some bruising to the back of the calf.  Came to the ED for further evaluation.  No history of DVT or PE.  No recent travel history.  No cancer history.  No hormone replacement.  No recent surgical procedures.  No chest pain or shortness of breath.  No pleuritic chest pain.  No hemoptysis.  Patient has some mild bruising to the posterior aspect of the right calf.  Mild pain to palpation.  Compartments soft.  No concerns of any kind of compartment syndrome.  Achilles is intact.  Strength and sensation intact distal to the injury site.  2+ dorsal pedal and posterior tibial pulses.  Concern for possible DVT.  Did obtain D-dimer.  Unremarkable.  I do think she would still benefit from DVT ultrasound. Have scheduled this for tomorrow.   Otherwise, could just be a calf strain.   Did not start on anticoagulation tonight given negative D dimer.  No chest pain or pleuritic chest pain.  No hemoptysis.  No concerns for PE.      Final diagnoses:   Hematoma  Pain of right lower extremity    ED Discharge Orders          Ordered    [facility]ENOUS        04/27/24 2105               Simon Lavonia SAILOR, MD 04/27/24 2318

## 2024-04-27 NOTE — ED Provider Triage Note (Signed)
 Emergency Medicine Provider Triage Evaluation Note  Tiffany Solomon , a 38 y.o. female  was evaluated in triage.  Pt complains of right calf pain and bruising.  Recent increase in physical exercise, and initially the same this is due to the same however due to increased bruising and tenderness in the medial calf she was concerned for potential blood clot.  This has been going on over the last 6 days denies dyspnea or chest pain  Review of Systems  Positive: As above Negative:   Physical Exam  BP 133/86 (BP Location: Right Arm)   Pulse 97   Temp 98 F (36.7 C)   Resp 18   SpO2 98%  Gen:   Awake, no distress   Resp:  Normal effort  MSK:   Moves extremities without difficulty  Other:  On the right lateral calf there is a large ecchymotic lesion, medial calf is exquisitely tender to palpation, no edema noted to the extremity.  Medical Decision Making  Medically screening exam initiated at 6:53 PM.  Appropriate orders placed.  Tiffany Solomon was informed that the remainder of the evaluation will be completed by another provider, this initial triage assessment does not replace that evaluation, and the importance of remaining in the ED until their evaluation is complete.  Initial order sets placed to rule out possible DVT.  Also considering possible muscle strain, mechanical injury to the calf/contusion.   Tiffany Solomon, Tiffany Solomon 04/27/24 [phone]

## 2024-04-27 NOTE — ED Triage Notes (Signed)
 Pt with pain and swelling along with ecchymosis to R calf. Pain started Monday. Denies shob and chest pain.

## 2024-04-28 ENCOUNTER — Ambulatory Visit (HOSPITAL_COMMUNITY)
Admission: RE | Admit: 2024-04-28 | Discharge: 2024-04-28 | Disposition: A | Discharge status: Home / Self Care | Source: Ambulatory Visit

## 2024-04-28 DIAGNOSIS — M79604 Pain in right leg: Secondary | ICD-10-CM | POA: Diagnosis not present

## 2024-04-28 DIAGNOSIS — M79661 Pain in right lower leg: Secondary | ICD-10-CM | POA: Insufficient documentation

## 2024-04-28 NOTE — Progress Notes (Signed)
 VASCULAR LAB    Bilateral lower extremity venous duplex has been performed.  See CV proc for preliminary results.   Brandi Armato, RVT 04/28/2024, 11:58 AM
